# Patient Record
Sex: Male | Born: 1960 | Race: White | Hispanic: No | Marital: Single | Smoking: Current every day smoker
Health system: Southern US, Community
[De-identification: ages and names within clinical notes are randomized; demographics above are authoritative.]

---

## 2009-10-14 ENCOUNTER — Emergency Department: Payer: Self-pay | Admitting: Emergency Medicine

## 2009-12-02 ENCOUNTER — Emergency Department: Payer: Self-pay | Admitting: Emergency Medicine

## 2011-01-06 ENCOUNTER — Emergency Department: Payer: Self-pay | Admitting: Emergency Medicine

## 2011-11-05 ENCOUNTER — Emergency Department: Payer: Self-pay | Admitting: *Deleted

## 2012-09-26 ENCOUNTER — Emergency Department: Payer: Self-pay | Admitting: Emergency Medicine

## 2013-01-20 ENCOUNTER — Emergency Department: Payer: Self-pay | Admitting: Emergency Medicine

## 2013-01-20 LAB — COMPREHENSIVE METABOLIC PANEL
Anion Gap: 5 — ABNORMAL LOW (ref 7–16)
BUN: 10 mg/dL (ref 7–18)
Calcium, Total: 7.7 mg/dL — ABNORMAL LOW (ref 8.5–10.1)
Co2: 27 mmol/L (ref 21–32)
Creatinine: 0.8 mg/dL (ref 0.60–1.30)
EGFR (African American): >60
EGFR (Non-African Amer.): >60
Glucose: 116 mg/dL — ABNORMAL HIGH (ref 65–99)
SGPT (ALT): 22 U/L (ref 12–78)
Sodium: 141 mmol/L (ref 136–145)
Total Protein: 6.9 g/dL (ref 6.4–8.2)

## 2013-01-20 LAB — CBC
HCT: 43.1 % (ref 40.0–52.0)
HGB: 14.4 g/dL (ref 13.0–18.0)
MCH: 31.4 pg (ref 26.0–34.0)
MCHC: 33.5 g/dL (ref 32.0–36.0)
MCV: 94 fL (ref 80–100)
Platelet: 296 10*3/uL (ref 150–440)
RBC: 4.59 10*6/uL (ref 4.40–5.90)
WBC: 9.4 10*3/uL (ref 3.8–10.6)

## 2013-01-20 LAB — DRUG SCREEN, URINE
Cannabinoid 50 Ng, Ur ~~LOC~~: NEGATIVE (ref ?–50)
Cocaine Metabolite,Ur ~~LOC~~: NEGATIVE (ref ?–300)
MDMA (Ecstasy)Ur Screen: NEGATIVE (ref ?–500)
Methadone, Ur Screen: NEGATIVE (ref ?–300)
Opiate, Ur Screen: NEGATIVE (ref ?–300)
Phencyclidine (PCP) Ur S: NEGATIVE (ref ?–25)

## 2013-01-20 LAB — URINALYSIS, COMPLETE
Bacteria: NONE SEEN
Bilirubin,UR: NEGATIVE
Blood: NEGATIVE
Glucose,UR: NEGATIVE mg/dL (ref 0–75)
Leukocyte Esterase: NEGATIVE
Nitrite: NEGATIVE
Protein: NEGATIVE
Specific Gravity: 1.009 (ref 1.003–1.030)
Squamous Epithelial: NONE SEEN

## 2013-01-20 LAB — ACETAMINOPHEN LEVEL: Acetaminophen: <2 ug/mL

## 2013-01-20 LAB — ETHANOL
Ethanol %: <0.003 % (ref 0.000–0.080)
Ethanol: <3 mg/dL

## 2013-01-20 LAB — SALICYLATE LEVEL: Salicylates, Serum: 2.5 mg/dL

## 2014-02-06 ENCOUNTER — Emergency Department: Payer: Self-pay | Admitting: Emergency Medicine

## 2014-10-24 NOTE — Consult Note (Signed)
PATIENT NAME:  Eric Booker, STEWART MR#:  [id number] DATE OF BIRTH:  [birth year]  DATE OF CONSULTATION:  01/21/2013  CONSULTING PHYSICIAN:  Izola PriceFrances C. Jaclynn MajorGreason, MD  CHIEF COMPLAINT: "I came here because I cut my head and they put me in here." When asked how that happened he replied, "I don't know, I don't have any idea." Then he goes on to say, "I don't even remember if I have had a knife, but I don't think I did not that I know of, but I was drinking, I had at least 10 beers. I usually just drink 1 to 2 every other day and then on the weekends  I might do drank 8, 9 or 10." " I use to drink over a 6 pack a day on the weekends, but I was at [facility] and I stayed sober for a while and then I  fell off the wagon." He does go on to say that he is drinking is more controlled then when he went to [facility]. His girlfriend was contacted and she reports "we had a mini argument. He was drinking and waving a knife around and accidentally cut himself in the head."   HISTORY OF PRESENT ILLNESS:  Eric Booker reports that he has a long history of alcohol. He reports that he first started drinking when he was 42. His last drink was on 01/19/2013. He reports that he drinks a beer or two a day and on the weekends he will drank 9 or 10 beers a day, as he buys a 12 pack. He denies any history of seizures or blackouts. He denies any other drug use.   MENTAL STATUS EXAM: Eric Booker is cooperative and talkative on interview. His affect is euthymic and at times puzzled. His thought processes are linear, logical and goal oriented. Memory is intact. He does not endorse any hallucinations, auditory or visual or delusions. There is no suicidal or homicidal ideation, intent or plan and he goes on to point out that he has no psychiatric history.  His primary psychomotor behavior is within normal limits. He is oriented x 4. His speech is cooperative. His insight and judgment are both poor to fair.   SOCIAL HISTORY: He reports that he lives  with his girlfriend and knows that he can return there and he feels safe with her. He reports he does not have an adequate personal support system. He reports that his girlfriend Tresa EndoKelly  is very helpful to him and tries to help him stay sober.   MEDICATIONS: None.   PAST MEDICAL AND SURGICAL HISTORY:  None.   ALLERGIES: BEE STINGS ITCHING, SHORTNESS OF BREATH, HIVES AND ANAPHYLAXIS.   DIAGNOSIS, PRINCIPAL AND PRIMARY:  AXIS I:   1.  Alcohol intoxication.  2.  Alcohol dependence.   AXIS II:  Deferred.   AXIS III: None.   RECOMMENDATIONS: 1.  Mr. Eric Booker Benegas will be detoxed and the Emergency Department.   2.  He has been referred to [facility] after detox.    ____________________________ Izola PriceFrances C. Jaclynn MajorGreason, MD fcg:cc D: 01/21/2013 13:26:58 ET T: 01/21/2013 14:41:45 ET JOB#: [id number]  cc: Izola PriceFrances C. Jaclynn MajorGreason, MD, <Dictator> Maryan PulsFRANCES C Lorrinda Ramstad MD ELECTRONICALLY SIGNED 01/21/2013 15:05

## 2014-10-24 NOTE — Consult Note (Signed)
Brief Consult Note: Diagnosis: Alcohol Dependence, Alcohol Intoxication.   Patient was seen by consultant.   Orders entered.   Comments: Pt seen in ED. He presented after excessive use of alcohol and was playing with a butcher knife and accidently cut himself on the head. he stated that his girlfriend was also present there and she called the police. He stated that he was in the [facility] recently and was unable to maintain sobriety after that. He denied Si/HI or plans.   Plan: Pt will be transferred to [facility] for alcohol dependence and showing impulsive behavior while intoxicated.  He agreed with the plan.  No new meds given at this time.  Electronic Signatures: Rhunette CroftFaheem, Albena Comes S (MD)  (Signed 22-Jul-14 12:51)  Authored: Brief Consult Note   Last Updated: 22-Jul-14 12:51 by Rhunette CroftFaheem, Kimarion Chery S (MD)

## 2016-01-08 ENCOUNTER — Encounter: Payer: Self-pay | Admitting: Emergency Medicine

## 2016-01-08 ENCOUNTER — Emergency Department
Admission: EM | Admit: 2016-01-08 | Discharge: 2016-01-08 | Disposition: A | Discharge status: Home / Self Care | Payer: Managed Care, Other (non HMO) | Attending: Emergency Medicine | Admitting: Emergency Medicine

## 2016-01-08 DIAGNOSIS — E860 Dehydration: Secondary | ICD-10-CM | POA: Insufficient documentation

## 2016-01-08 DIAGNOSIS — F17200 Nicotine dependence, unspecified, uncomplicated: Secondary | ICD-10-CM | POA: Diagnosis not present

## 2016-01-08 DIAGNOSIS — R079 Chest pain, unspecified: Secondary | ICD-10-CM | POA: Diagnosis present

## 2016-01-08 DIAGNOSIS — R197 Diarrhea, unspecified: Secondary | ICD-10-CM

## 2016-01-08 LAB — LIPASE, BLOOD: Lipase: 35 U/L (ref 11–51)

## 2016-01-08 LAB — COMPREHENSIVE METABOLIC PANEL
ALBUMIN: 4.1 g/dL (ref 3.5–5.0)
ALK PHOS: 76 U/L (ref 38–126)
ALT: 26 U/L (ref 17–63)
ANION GAP: 10 (ref 5–15)
AST: 42 U/L — ABNORMAL HIGH (ref 15–41)
BILIRUBIN TOTAL: 1.3 mg/dL — AB (ref 0.3–1.2)
BUN: 6 mg/dL (ref 6–20)
CALCIUM: 9.1 mg/dL (ref 8.9–10.3)
CO2: 25 mmol/L (ref 22–32)
Chloride: 98 mmol/L — ABNORMAL LOW (ref 101–111)
Creatinine, Ser: 0.73 mg/dL (ref 0.61–1.24)
GLUCOSE: 130 mg/dL — AB (ref 65–99)
Potassium: 3.5 mmol/L (ref 3.5–5.1)
Sodium: 133 mmol/L — ABNORMAL LOW (ref 135–145)
TOTAL PROTEIN: 7.8 g/dL (ref 6.5–8.1)

## 2016-01-08 LAB — CBC WITH DIFFERENTIAL/PLATELET
Basophils Absolute: 0.2 10*3/uL — ABNORMAL HIGH (ref 0–0.1)
Basophils Relative: 2 %
EOS ABS: 0.2 10*3/uL (ref 0–0.7)
Eosinophils Relative: 2 %
HEMATOCRIT: 49.1 % (ref 40.0–52.0)
HEMOGLOBIN: 17.5 g/dL (ref 13.0–18.0)
Lymphs Abs: 1.2 10*3/uL (ref 1.0–3.6)
MCH: 32.2 pg (ref 26.0–34.0)
MCHC: 35.5 g/dL (ref 32.0–36.0)
MCV: 90.5 fL (ref 80.0–100.0)
Monocytes Absolute: 0.8 10*3/uL (ref 0.2–1.0)
Neutro Abs: 6.2 10*3/uL (ref 1.4–6.5)
Platelets: 257 10*3/uL (ref 150–440)
RBC: 5.43 MIL/uL (ref 4.40–5.90)
RDW: 13.5 % (ref 11.5–14.5)
WBC: 8.6 10*3/uL (ref 3.8–10.6)

## 2016-01-08 MED ORDER — SODIUM CHLORIDE 0.9 % IV BOLUS (SEPSIS)
1000.0000 mL | Freq: Once | INTRAVENOUS | Status: AC
  Administered 2016-01-08: 1000 mL via INTRAVENOUS

## 2016-01-08 MED ORDER — ONDANSETRON HCL 4 MG/2ML IJ SOLN
4.0000 mg | Freq: Once | INTRAMUSCULAR | Status: AC
  Administered 2016-01-08: 4 mg via INTRAVENOUS
  Filled 2016-01-08: qty 2

## 2016-01-08 NOTE — Discharge Instructions (Signed)
Dehydration, Adult Dehydration is a condition in which you do not have enough fluid or water in your body. It happens when you take in less fluid than you lose. Vital organs such as the kidneys, brain, and heart cannot function without a proper amount of fluids. Any loss of fluids from the body can cause dehydration.  Dehydration can range from mild to severe. This condition should be treated right away to help prevent it from becoming severe. CAUSES  This condition may be caused by:  Vomiting.  Diarrhea.  Excessive sweating, such as when exercising in hot or humid weather.  Not drinking enough fluid during strenuous exercise or during an illness.  Excessive urine output.  Fever.  Certain medicines. RISK FACTORS This condition is more likely to develop in:  People who are taking certain medicines that cause the body to lose excess fluid (diuretics).   People who have a chronic illness, such as diabetes, that may increase urination.  Older adults.   People who live at high altitudes.   People who participate in endurance sports.  SYMPTOMS  Mild Dehydration  Thirst.  Dry lips.  Slightly dry mouth.  Dry, warm skin. Moderate Dehydration  Very dry mouth.   Muscle cramps.   Dark urine and decreased urine production.   Decreased tear production.   Headache.   Light-headedness, especially when you stand up from a sitting position.  Severe Dehydration  Changes in skin.   Cold and clammy skin.   Skin does not spring back quickly when lightly pinched and released.   Changes in body fluids.   Extreme thirst.   No tears.   Not able to sweat when body temperature is high, such as in hot weather.   Minimal urine production.   Changes in vital signs.   Rapid, weak pulse (more than 100 beats per minute when you are sitting still).   Rapid breathing.   Low blood pressure.   Other changes.   Sunken eyes.   Cold hands and feet.    Confusion.  Lethargy and difficulty being awakened.  Fainting (syncope).   Short-term weight loss.   Unconsciousness. DIAGNOSIS  This condition may be diagnosed based on your symptoms. You may also have tests to determine how severe your dehydration is. These tests may include:   Urine tests.   Blood tests.  TREATMENT  Treatment for this condition depends on the severity. Mild or moderate dehydration can often be treated at home. Treatment should be started right away. Do not wait until dehydration becomes severe. Severe dehydration needs to be treated at the hospital. Treatment for Mild Dehydration  Drinking plenty of water to replace the fluid you have lost.   Replacing minerals in your blood (electrolytes) that you may have lost.  Treatment for Moderate Dehydration  Consuming oral rehydration solution (ORS). Treatment for Severe Dehydration  Receiving fluid through an IV tube.   Receiving electrolyte solution through a feeding tube that is passed through your nose and into your stomach (nasogastric tube or NG tube).  Correcting any abnormalities in electrolytes. HOME CARE INSTRUCTIONS   Drink enough fluid to keep your urine clear or pale yellow.   Drink water or fluid slowly by taking small sips. You can also try sucking on ice cubes.  Have food or beverages that contain electrolytes. Examples include bananas and sports drinks.  Take over-the-counter and prescription medicines only as told by your health care provider.   Prepare ORS according to the manufacturer's instructions. Take sips   of ORS every 5 minutes until your urine returns to normal.  If you have vomiting or diarrhea, continue to try to drink water, ORS, or both.   If you have diarrhea, avoid:   Beverages that contain caffeine.   Fruit juice.   Milk.   Carbonated soft drinks.  Do not take salt tablets. This can lead to the condition of having too much sodium in your body  (hypernatremia).  SEEK MEDICAL CARE IF:  You cannot eat or drink without vomiting.  You have had moderate diarrhea during a period of more than 24 hours.  You have a fever. SEEK IMMEDIATE MEDICAL CARE IF:   You have extreme thirst.  You have severe diarrhea.  You have not urinated in 6-8 hours, or you have urinated only a small amount of very dark urine.  You have shriveled skin.  You are dizzy, confused, or both.   This information is not intended to replace advice given to you by your health care provider. Make sure you discuss any questions you have with your health care provider.   Document Released: 06/20/2005 Document Revised: 03/11/2015 Document Reviewed: 11/05/2014 Elsevier Interactive Patient Education 2016 Else[employer]   Diarrhea Diarrhea is frequent loose and watery bowel movements. It can cause you to feel weak and dehydrated. Dehydration can cause you to become tired and thirsty, have a dry mouth, and have decreased urination that often is dark yellow. Diarrhea is a sign of another problem, most often an infection that will not last long. In most cases, diarrhea typically lasts 2-3 days. However, it can last longer if it is a sign of something more serious. It is important to treat your diarrhea as directed by your caregiver to lessen or prevent future episodes of diarrhea. CAUSES  Some common causes include:  Gastrointestinal infections caused by viruses, bacteria, or parasites.  Food poisoning or food allergies.  Certain medicines, such as antibiotics, chemotherapy, and laxatives.  Artificial sweeteners and fructose.  Digestive disorders. HOME CARE INSTRUCTIONS  Ensure adequate fluid intake (hydration): Have 1 cup (8 oz) of fluid for each diarrhea episode. Avoid fluids that contain simple sugars or sports drinks, fruit juices, whole milk products, and sodas. Your urine should be clear or pale yellow if you are drinking enough fluids. Hydrate with an oral  rehydration solution that you can purchase at pharmacies, retail stores, and online. You can prepare an oral rehydration solution at home by mixing the following ingredients together:   - tsp table salt.   tsp baking soda.   tsp salt substitute containing potassium chloride.  1  tablespoons sugar.  1 L (34 oz) of water.  Certain foods and beverages may increase the speed at which food moves through the gastrointestinal (GI) tract. These foods and beverages should be avoided and include:  Caffeinated and alcoholic beverages.  High-fiber foods, such as raw fruits and vegetables, nuts, seeds, and whole grain breads and cereals.  Foods and beverages sweetened with sugar alcohols, such as xylitol, sorbitol, and mannitol.  Some foods may be well tolerated and may help thicken stool including:  Starchy foods, such as rice, toast, pasta, low-sugar cereal, oatmeal, grits, baked potatoes, crackers, and bagels.  Bananas.  Applesauce.  Add probiotic-rich foods to help increase healthy bacteria in the GI tract, such as yogurt and fermented milk products.  Wash your hands well after each diarrhea episode.  Only take over-the-counter or prescription medicines as directed by your caregiver.  Take a warm bath to relieve any   burning or pain from frequent diarrhea episodes. SEEK IMMEDIATE MEDICAL CARE IF:   You are unable to keep fluids down.  You have persistent vomiting.  You have blood in your stool, or your stools are black and tarry.  You do not urinate in 6-8 hours, or there is only a small amount of very dark urine.  You have abdominal pain that increases or localizes.  You have weakness, dizziness, confusion, or light-headedness.  You have a severe headache.  Your diarrhea gets worse or does not get better.  You have a fever or persistent symptoms for more than 2-3 days.  You have a fever and your symptoms suddenly get worse. MAKE SURE YOU:   Understand these  instructions.  Will watch your condition.  Will get help right away if you are not doing well or get worse.   This information is not intended to replace advice given to you by your health care provider. Make sure you discuss any questions you have with your health care provider.   Document Released: 06/10/2002 Document Revised: 07/11/2014 Document Reviewed: 02/26/2012 Elsevier Interactive Patient Education 2016 Elsevier Inc.  

## 2016-01-08 NOTE — ED Notes (Signed)
Reports cp x 3 days, no pain right now. Skin w/d.

## 2016-01-08 NOTE — ED Notes (Signed)
Pt verbalized understanding of discharge instructions. NAD at this time. 

## 2016-01-08 NOTE — ED Provider Notes (Signed)
[facility] Emergency Department Provider Note  ____________________________________________  Time seen: 9:00 AM  I have reviewed the triage vital signs and the nursing notes.   HISTORY  Chief Complaint Chest Pain    HPI Eric Booker is a 55 y.o. male reports multiple complaints. He's been having chest pain is described as sharp, intermittent lasting 5 seconds at a time, left lateral chest wall. Not exertional or pleuritic. Not associated with shortness of breath vomiting diaphoresis or dizziness. No radiation. No aggravating or alleviating factors.  Also reports that for the last 24 hours he's felt that his heart is pounding harder than usual. Also notes that he's had diarrhea for 3 days and had to call out from work during this time. No nausea or vomiting. No sick contacts. He also reports that he doesn't drink much water because his tap water at home doesn't taste good and he forgets to buy bottled water. He agrees that he doesn't drink much by way of oral fluids and is likely not been keeping up with the fluid losses with his diarrhea.     No past medical history on file. None  There are no active problems to display for this patient.    No past surgical history on file. Right index finger amputation  No current outpatient prescriptions on file.  None Allergies Review of patient's allergies indicates no known allergies.   No family history on file.  Social History Social History  Substance Use Topics  . Smoking status: Current Every Day Smoker  . Smokeless tobacco: None  . Alcohol Use: None    Review of Systems  Constitutional:   No fever or chills.  ENT:   No sore throat. No rhinorrhea. Cardiovascular:   Positive as above chest pain. Positive palpitations Respiratory:   No dyspnea or cough. Gastrointestinal:   Negative for abdominal pain, no vomiting. Positive diarrhea.  Genitourinary:   Negative for dysuria or difficulty  urinating. Musculoskeletal:   Negative for focal pain or swelling Neurological:   Negative for headaches 10-point ROS otherwise negative.  ____________________________________________   PHYSICAL EXAM:  VITAL SIGNS: ED Triage Vitals  Enc Vitals Group     BP 01/08/16 0853 136/94 mmHg     Pulse Rate 01/08/16 0853 109     Resp 01/08/16 0853 18     Temp 01/08/16 0853 98 F (36.7 C)     Temp Source 01/08/16 0853 Oral     SpO2 01/08/16 0853 97 %     Weight 01/08/16 0853 180 lb (81.647 kg)     Height 01/08/16 0853 6\' 1"  (1.854 m)     Head Cir --      Peak Flow --      Pain Score --      Pain Loc --      Pain Edu? --      Excl. in GC? --     Vital signs reviewed, nursing assessments reviewed.   Constitutional:   Alert and oriented. Well appearing and in no distress. Eyes:   No scleral icterus. No conjunctival pallor. PERRL. EOMI.  No nystagmus. ENT   Head:   Normocephalic and atraumatic.   Nose:   No congestion/rhinnorhea. No septal hematoma   Mouth/Throat:   Dry mucous membranes, no pharyngeal erythema. No peritonsillar mass.    Neck:   No stridor. No SubQ emphysema. No meningismus. Hematological/Lymphatic/Immunilogical:   No cervical lymphadenopathy. Cardiovascular:   Tachycardia heart rate 110, regular. Symmetric bilateral radial and DP pulses.  No murmurs.  Respiratory:   Normal respiratory effort without tachypnea nor retractions. Breath sounds are clear and equal bilaterally. No wheezes/rales/rhonchi. Gastrointestinal:   Soft with mild left upper quadrant tenderness. Non distended. There is no CVA tenderness.  No rebound, rigidity, or guarding. Genitourinary:   deferred Musculoskeletal:   Nontender with normal range of motion in all extremities. No joint effusions.  No lower extremity tenderness.  No edema. Neurologic:   Normal speech and language.  CN 2-10 normal. Motor grossly intact. No gross focal neurologic deficits are appreciated.  Skin:    Skin is  warm, dry and intact. No rash noted.  No petechiae, purpura, or bullae.  ____________________________________________    LABS (pertinent positives/negatives) (all labs ordered are listed, but only abnormal results are displayed) Labs Reviewed  COMPREHENSIVE METABOLIC PANEL - Abnormal; Notable for the following:    Sodium 133 (*)    Chloride 98 (*)    Glucose, Bld 130 (*)    AST 42 (*)    Total Bilirubin 1.3 (*)    All other components within normal limits  CBC WITH DIFFERENTIAL/PLATELET - Abnormal; Notable for the following:    Basophils Absolute 0.2 (*)    All other components within normal limits  LIPASE, BLOOD   ____________________________________________   EKG  Interpreted by me Sinus tachycardia rate 103, normal axis and intervals. Poor R-wave progression in anterior precordial leads. Normal ST segments and T waves  ____________________________________________    RADIOLOGY    ____________________________________________   PROCEDURES   ____________________________________________   INITIAL IMPRESSION / ASSESSMENT AND PLAN / ED COURSE  Pertinent labs & imaging results that were available during my care of the patient were reviewed by me and considered in my medical decision making (see chart for details).  Patient well appearing no acute distress. Presents with fleeting chest pain that is on concerning at the moment. EKG unremarkable. Clinically appears to be dehydrated likely due to GI losses and inadequate oral intake.Considering the patient's symptoms, medical history, and physical examination today, I have low suspicion for ACS, PE, TAD, pneumothorax, carditis, mediastinitis, pneumonia, CHF, or sepsis.  Check labs, give IV saline. Oral challenge. Anticipate needing a discharge the patient home later once reassuring workup is obtained.   ----------------------------------------- 9:56 AM on 01/08/2016 -----------------------------------------  Labs  unremarkable. Heart rate improved to 80 with about 800 mL of saline infused. Patient tolerating oral intake and feels much better. We'll discharge home, follow up with primary care, patient will increase his oral fluid intake. Diarrhea does not appear to be infectious in origin.   ____________________________________________   FINAL CLINICAL IMPRESSION(S) / ED DIAGNOSES  Final diagnoses:  Dehydration  Diarrhea, unspecified type       Portions of this note were generated with dragon dictation software. Dictation errors may occur despite best attempts at proofreading.   Sharman CheekPhillip Tunis Gentle, MD 01/08/16 [phone]

## 2018-04-10 ENCOUNTER — Emergency Department
Admission: EM | Admit: 2018-04-10 | Discharge: 2018-04-10 | Discharge status: Against Medical Advice | Payer: 59 | Attending: Emergency Medicine | Admitting: Emergency Medicine

## 2018-04-10 ENCOUNTER — Other Ambulatory Visit: Payer: Self-pay

## 2018-04-10 DIAGNOSIS — F17200 Nicotine dependence, unspecified, uncomplicated: Secondary | ICD-10-CM | POA: Insufficient documentation

## 2018-04-10 DIAGNOSIS — F1020 Alcohol dependence, uncomplicated: Secondary | ICD-10-CM

## 2018-04-10 LAB — CBC WITH DIFFERENTIAL/PLATELET
ABS IMMATURE GRANULOCYTES: 0.04 10*3/uL (ref 0.00–0.07)
Basophils Absolute: 0 10*3/uL (ref 0.0–0.1)
Basophils Relative: 1 %
Eosinophils Absolute: 0 10*3/uL (ref 0.0–0.5)
Eosinophils Relative: 0 %
HCT: 44.1 % (ref 39.0–52.0)
HEMOGLOBIN: 15.5 g/dL (ref 13.0–17.0)
IMMATURE GRANULOCYTES: 1 %
LYMPHS ABS: 1.7 10*3/uL (ref 0.7–4.0)
LYMPHS PCT: 21 %
MCH: 32.4 pg (ref 26.0–34.0)
MCHC: 35.1 g/dL (ref 30.0–36.0)
MCV: 92.1 fL (ref 80.0–100.0)
MONOS PCT: 11 %
Monocytes Absolute: 0.9 10*3/uL (ref 0.1–1.0)
NEUTROS ABS: 5.4 10*3/uL (ref 1.7–7.7)
Neutrophils Relative %: 66 %
PLATELETS: 225 10*3/uL (ref 150–400)
RBC: 4.79 MIL/uL (ref 4.22–5.81)
RDW: 13.5 % (ref 11.5–15.5)
WBC: 8.1 10*3/uL (ref 4.0–10.5)
nRBC: 0 % (ref 0.0–0.2)

## 2018-04-10 LAB — COMPREHENSIVE METABOLIC PANEL
ALK PHOS: 43 U/L (ref 38–126)
ALT: 32 U/L (ref 0–44)
AST: 56 U/L — AB (ref 15–41)
Albumin: 4.1 g/dL (ref 3.5–5.0)
Anion gap: 15 (ref 5–15)
BUN: 7 mg/dL (ref 6–20)
CO2: 22 mmol/L (ref 22–32)
Calcium: 8.5 mg/dL — ABNORMAL LOW (ref 8.9–10.3)
Chloride: 99 mmol/L (ref 98–111)
Creatinine, Ser: 0.59 mg/dL — ABNORMAL LOW (ref 0.61–1.24)
GFR calc non Af Amer: >60 mL/min (ref >60)
GLUCOSE: 95 mg/dL (ref 70–99)
POTASSIUM: 3.9 mmol/L (ref 3.5–5.1)
Sodium: 136 mmol/L (ref 135–145)
TOTAL PROTEIN: 7.1 g/dL (ref 6.5–8.1)
Total Bilirubin: 0.6 mg/dL (ref 0.3–1.2)

## 2018-04-10 LAB — URINE DRUG SCREEN, QUALITATIVE (ARMC ONLY)
AMPHETAMINES, UR SCREEN: NOT DETECTED
Barbiturates, Ur Screen: NOT DETECTED
Benzodiazepine, Ur Scrn: NOT DETECTED
COCAINE METABOLITE, UR ~~LOC~~: NOT DETECTED
Cannabinoid 50 Ng, Ur ~~LOC~~: NOT DETECTED
MDMA (ECSTASY) UR SCREEN: NOT DETECTED
Methadone Scn, Ur: NOT DETECTED
OPIATE, UR SCREEN: NOT DETECTED
PHENCYCLIDINE (PCP) UR S: NOT DETECTED
Tricyclic, Ur Screen: NOT DETECTED

## 2018-04-10 LAB — ETHANOL: Alcohol, Ethyl (B): 237 mg/dL — ABNORMAL HIGH (ref <10)

## 2018-04-10 LAB — TROPONIN I: Troponin I: <0.03 ng/mL (ref <0.03)

## 2018-04-10 MED ORDER — LORAZEPAM 2 MG PO TABS
2.0000 mg | ORAL_TABLET | Freq: Once | ORAL | Status: DC
  Filled 2018-04-10: qty 1

## 2018-04-10 NOTE — BH Assessment (Signed)
Writer discussed with the patient about the option for detox/treatment.  Patient was in the agreement with the plan.  Patient signed the Release of information, in order to send labs and ER Note.  Information sent sent to [facility] & [facility].

## 2018-04-10 NOTE — ED Provider Notes (Signed)
The patient left the emergency department several times, and we reiterated that he needed to stay in order to continue to receive treatment.  He did say that he would be willing to wait and we were able to get him a spot for inpatient detoxification at [facility].  Unfortunately, the patient is not willing to stay and has left at this time.   Eric Menghini, MD 04/10/18 [phone]

## 2018-04-10 NOTE — ED Notes (Signed)
Pt in lobby, requesting to come back to his bed. RN informed by patient relations representative. EDP, charge nurse and TTS made aware.

## 2018-04-10 NOTE — ED Provider Notes (Signed)
[facility] Emergency Department Provider Note       Time seen: ----------------------------------------- 1:28 PM on 04/10/2018 -----------------------------------------   I have reviewed the triage vital signs and the nursing notes.  HISTORY   Chief Complaint Alcohol Problem    HPI Eric Booker is a 58 y.o. male with a history of alcohol abuse who presents to the ED for detox.  Patient was sent here from detox because his alcohol level is too high.  They were concerned because he had an alcohol level near 400.  He denies any complaints at this time.  Patient states he is trying to finally quit drinking because he started having trouble remembering what happened at the end of football games.  No past medical history on file.  There are no active problems to display for this patient.  Allergies Bee venom  Social History Social History   Tobacco Use  . Smoking status: Current Every Day Smoker  . Smokeless tobacco: Current User  Substance Use Topics  . Alcohol use: Yes    Alcohol/week: 12.0 standard drinks    Types: 12 Cans of beer per week  . Drug use: Not on file   Review of Systems Constitutional: Negative for fever. Cardiovascular: Negative for chest pain. Respiratory: Negative for shortness of breath. Gastrointestinal: Negative for abdominal pain, vomiting and diarrhea. Musculoskeletal: Negative for back pain. Skin: Negative for rash. Neurological: Negative for headaches, focal weakness or numbness.  Positive for memory disturbance  All systems negative/normal/unremarkable except as stated in the HPI  ____________________________________________   PHYSICAL EXAM:  VITAL SIGNS: ED Triage Vitals  Enc Vitals Group     BP 04/10/18 1318 (!) 142/88     Pulse Rate 04/10/18 1318 84     Resp 04/10/18 1318 20     Temp 04/10/18 1318 98.3 F (36.8 C)     Temp Source 04/10/18 1318 Oral     SpO2 04/10/18 1318 100 %     Weight 04/10/18 1314  180 lb (81.6 kg)     Height 04/10/18 1314 (!) 6" (0.152 m)     Head Circumference --      Peak Flow --      Pain Score 04/10/18 1314 0     Pain Loc --      Pain Edu? --      Excl. in GC? --    Constitutional: Alert and oriented.  Smells of alcohol, no distress Eyes: Conjunctivae are normal. Normal extraocular movements. ENT   Head: Normocephalic and atraumatic.   Nose: No congestion/rhinnorhea.   Mouth/Throat: Mucous membranes are moist.   Neck: No stridor. Cardiovascular: Normal rate, regular rhythm. No murmurs, rubs, or gallops. Respiratory: Normal respiratory effort without tachypnea nor retractions. Breath sounds are clear and equal bilaterally. No wheezes/rales/rhonchi. Gastrointestinal: Soft and nontender. Normal bowel sounds Musculoskeletal: Nontender with normal range of motion in extremities. No lower extremity tenderness nor edema. Neurologic:  Normal speech and language. No gross focal neurologic deficits are appreciated.  Mild tremor is noted Skin:  Skin is warm, dry and intact. No rash noted. Psychiatric: Mood and affect are normal. Speech and behavior are normal.  ____________________________________________  ED COURSE:  As part of my medical decision making, I reviewed the following data within the electronic MEDICAL RECORD NUMBER History obtained from family if available, nursing notes, old chart and ekg, as well as notes from prior ED visits. Patient presented for detox clearance, we will assess with labs and imaging as indicated at this time.  Clinical Course as of Apr 10 1452  Tue Apr 10, 2018  1358 Patient has refused Ativan for alcohol detox   [JW]    Clinical Course User Index [JW] Emily Filbert, MD   Procedures ____________________________________________   LABS (pertinent positives/negatives)  Labs Reviewed  ETHANOL - Abnormal; Notable for the following components:      Result Value   Alcohol, Ethyl (B) 237 (*)    All other components  within normal limits  CBC WITH DIFFERENTIAL/PLATELET  COMPREHENSIVE METABOLIC PANEL  TROPONIN I  URINE DRUG SCREEN, QUALITATIVE ([facility] ONLY)   ___________________________________________  DIFFERENTIAL DIAGNOSIS   Alcohol use disorder, intoxication  FINAL ASSESSMENT AND PLAN  Alcohol use disorder   Plan: The patient had presented for high alcohol levels.  Patient refused Ativan here to prevent severe withdrawal.  Alcohol level was not as high as expected.   Ulice Dash, MD   Note: This note was generated in part or whole with voice recognition software. Voice recognition is usually quite accurate but there are transcription errors that can and very often do occur. I apologize for any typographical errors that were not detected and corrected.     Emily Filbert, MD 04/10/18 [id number]

## 2018-04-10 NOTE — ED Notes (Signed)
Pt informed staff he is ready to leave the ER.   EDP made aware. Pt agreed wait for discharge paperwork and follow up information from TTS.

## 2018-04-10 NOTE — ED Notes (Signed)
Pt has returned to 19 hall. Pt to wait for transportation to [facility] later this evening.

## 2018-04-10 NOTE — Discharge Instructions (Addendum)
Please return to the emergency department for shakiness, high heart rate, seizures, thoughts of hurting yourself or anyone else, or for any other symptoms concerning to you.

## 2018-04-10 NOTE — ED Notes (Signed)
Pt walked out of ER. Pt told the writer he had found better help somewhere else. EDP, TTS and charge nurse made aware.

## 2018-04-10 NOTE — BH Assessment (Signed)
Patient has been accepted to [facility].  Patient assigned to [facility] Accepting physician is Dr. Erling Conte.  Call report to [phone]. [phone].  Representative was Clydie Braun.   ER Staff is aware of it:  [place], ER Secretary  Dr. Sharma Covert,, ER MD  Amy B., Patient's Nurse   Patient be will be available after 8:00apm. Patient to transfer via [employer].  Address: [address] [facility]  [place], [place] [address]

## 2018-04-10 NOTE — ED Triage Notes (Signed)
Pt arrived via ems fo alcohol detox. Pt NAD, A&O x4. Pt not suicidal or homicidal. Pt pleasant and cooperative.

## 2018-04-10 NOTE — BH Assessment (Signed)
Assessment Note  Eric Booker is an 57 y.o. male who presents to the ER after he was seen at the local community mental health agency ([facility]). He was seeking assistance for alcohol abuse. He drinks on a daily basis and it's in the amount of 12 pack of 12oz beers. He further states he has drank this amount and frequency for several years and he is unsure of how long he's been sober. Withdrawal symptoms include; shakes, nausea, cold sweats and some weakness. Patient denies history of seizures and blacks. Patient also denies involvement with the legal system and no history of violence.   Upon arrival to the ER patient BAC was 237. UDS hadn't resulted when this assessment was completed.  Patient denies SI/HI and AV/H.  Diagnosis: Alcohol Abuse   Past Medical History: No past medical history on file.  Family History: No family history on file.  Social History:  reports that he has been smoking. He uses smokeless tobacco. He reports that he drinks about 12.0 standard drinks of alcohol per week. His drug history is not on file.  Additional Social History:  Alcohol / Drug Use Pain Medications: See PTA Prescriptions: See PTA Over the Counter: See PTA History of alcohol / drug use?: Yes Longest period of sobriety (when/how long): "For about a day" Negative Consequences of Use: Personal relationships Withdrawal Symptoms: Weakness, Sweats, Fever / Chills, Tremors Substance #1 Name of Substance 1: Alcohol 1 - Age of First Use: 18 1 - Amount (size/oz): "12 pack" 1 - Frequency: Daily 1 - Duration: "For years" 1 - Last Use / Amount: 04/10/2018  CIWA: CIWA-Ar BP: (!) 142/88 Pulse Rate: 84 COWS:    Allergies:  Allergies  Allergen Reactions  . Bee Venom Swelling    Home Medications:  (Not in a hospital admission)  OB/GYN Status:  No LMP for male patient.  General Assessment Data Location of Assessment: [facility] ED TTS Assessment: In system Is this a Tele or Face-to-Face Assessment?:  Face-to-Face Is this an Initial Assessment or a Re-assessment for this encounter?: Initial Assessment Patient Accompanied by:: N/A Language Other than English: No Living Arrangements: Other (Comment)(Private Home) What gender do you identify as?: Male Marital status: Long term relationship Pregnancy Status: No Living Arrangements: Spouse/significant other Can pt return to current living arrangement?: Yes Admission Status: Voluntary Is patient capable of signing voluntary admission?: Yes Referral Source: Self/Family/Friend Insurance type: Designer, industrial/product Exam [facility] Walk-in ONLY) Medical Exam completed: Yes  Crisis Care Plan Living Arrangements: Spouse/significant other Legal Guardian: Other:(Self) Name of Psychiatrist: Reports of none Name of Therapist: Reports of none  Education Status Is patient currently in school?: No Is the patient employed, unemployed or receiving disability?: Employed  Risk to self with the past 6 months Suicidal Ideation: No Has patient been a risk to self within the past 6 months prior to admission? : No Suicidal Intent: No Has patient had any suicidal intent within the past 6 months prior to admission? : No Is patient at risk for suicide?: No Suicidal Plan?: No Has patient had any suicidal plan within the past 6 months prior to admission? : No Access to Means: No What has been your use of drugs/alcohol within the last 12 months?: Alcohol Previous Attempts/Gestures: No How many times?: 0 Other Self Harm Risks: Active addiction Triggers for Past Attempts: None known Intentional Self Injurious Behavior: None Family Suicide History: No Recent stressful life event(s): Other (Comment)(Active addiction) Persecutory voices/beliefs?: No Depression: No Depression Symptoms: Guilt Substance abuse history  and/or treatment for substance abuse?: Yes Suicide prevention information given to non-admitted patients: Not applicable  Risk to Others  within the past 6 months Homicidal Ideation: No Does patient have any lifetime risk of violence toward others beyond the six months prior to admission? : No Thoughts of Harm to Others: No Current Homicidal Intent: No Current Homicidal Plan: No Access to Homicidal Means: No Identified Victim: Reports of none History of harm to others?: No Assessment of Violence: None Noted Violent Behavior Description: Reports of none Does patient have access to weapons?: No Criminal Charges Pending?: No Does patient have a court date: No Is patient on probation?: No  Psychosis Hallucinations: None noted Delusions: None noted  Mental Status Report Appearance/Hygiene: Unremarkable, In scrubs Eye Contact: Fair Motor Activity: Freedom of movement, Unremarkable Speech: Logical/coherent, Unremarkable Level of Consciousness: Alert Mood: Sad, Pleasant Affect: Appropriate to circumstance Anxiety Level: Minimal Thought Processes: Coherent, Relevant Judgement: Impaired Orientation: Person, Place, Time, Situation, Appropriate for developmental age Obsessive Compulsive Thoughts/Behaviors: Minimal  Cognitive Functioning Concentration: Normal Memory: Recent Intact, Remote Intact Is patient IDD: No Insight: Fair Impulse Control: Fair Appetite: Good Have you had any weight changes? : No Change Sleep: No Change Total Hours of Sleep: 8 Vegetative Symptoms: None  ADLScreening [facility] Assessment Services) Patient's cognitive ability adequate to safely complete daily activities?: Yes Patient able to express need for assistance with ADLs?: Yes Independently performs ADLs?: Yes (appropriate for developmental age)  Prior Inpatient Therapy Prior Inpatient Therapy: Yes Prior Therapy Dates: 2013 Prior Therapy Facilty/Provider(s): [facility] Reason for Treatment: Substance Abuse  Prior Outpatient Therapy Prior Outpatient Therapy: No Does patient have an ACCT team?: No Does patient have Intensive In-House  Services?  : No Does patient have [facility] services? : No Does patient have P4CC services?: No  ADL Screening (condition at time of admission) Patient's cognitive ability adequate to safely complete daily activities?: Yes Is the patient deaf or have difficulty hearing?: No Does the patient have difficulty seeing, even when wearing glasses/contacts?: No Does the patient have difficulty concentrating, remembering, or making decisions?: No Patient able to express need for assistance with ADLs?: Yes Does the patient have difficulty dressing or bathing?: No Independently performs ADLs?: Yes (appropriate for developmental age) Does the patient have difficulty walking or climbing stairs?: No Weakness of Legs: None Weakness of Arms/Hands: None  Home Assistive Devices/Equipment Home Assistive Devices/Equipment: None  Therapy Consults (therapy consults require a physician order) PT Evaluation Needed: No OT Evalulation Needed: No SLP Evaluation Needed: No Abuse/Neglect Assessment (Assessment to be complete while patient is alone) Abuse/Neglect Assessment Can Be Completed: Yes Physical Abuse: Denies Verbal Abuse: Denies Sexual Abuse: Denies Exploitation of patient/patient's resources: Denies Self-Neglect: Denies Values / Beliefs Cultural Requests During Hospitalization: None Spiritual Requests During Hospitalization: None Consults Spiritual Care Consult Needed: No Social Work Consult Needed: No Merchant navy officer (For Healthcare) Does Patient Have a Medical Advance Directive?: No Would patient like information on creating a medical advance directive?: No - Patient declined       Child/Adolescent Assessment Running Away Risk: (Patient is an adult)  Disposition:     On Site Evaluation by:   Reviewed with Physician:    Lilyan Gilford MS, LCAS, LPC, NCC, CCSI Therapeutic Triage Specialist 04/10/2018 2:11 PM

## 2018-04-10 NOTE — ED Notes (Signed)
Pt walked out of ER. Pt stated he had found better help somewhere else. EDP, TTS and charge nurse made aware.

## 2018-04-10 NOTE — ED Notes (Addendum)
Pt walked out of ER.   *Pt was up for discharge, but did not wait for discharge paperwork or information from TTS.

## 2019-05-01 ENCOUNTER — Emergency Department: Payer: 59

## 2019-05-01 ENCOUNTER — Emergency Department
Admission: EM | Admit: 2019-05-01 | Discharge: 2019-05-01 | Disposition: A | Discharge status: Home / Self Care | Payer: 59 | Attending: Student in an Organized Health Care Education/Training Program | Admitting: Student in an Organized Health Care Education/Training Program

## 2019-05-01 ENCOUNTER — Other Ambulatory Visit: Payer: Self-pay

## 2019-05-01 DIAGNOSIS — F17200 Nicotine dependence, unspecified, uncomplicated: Secondary | ICD-10-CM | POA: Insufficient documentation

## 2019-05-01 DIAGNOSIS — E860 Dehydration: Secondary | ICD-10-CM | POA: Diagnosis not present

## 2019-05-01 DIAGNOSIS — R1013 Epigastric pain: Secondary | ICD-10-CM | POA: Diagnosis present

## 2019-05-01 LAB — COMPREHENSIVE METABOLIC PANEL
ALT: 32 U/L (ref 0–44)
AST: 64 U/L — ABNORMAL HIGH (ref 15–41)
Albumin: 4.1 g/dL (ref 3.5–5.0)
Alkaline Phosphatase: 52 U/L (ref 38–126)
Anion gap: 17 — ABNORMAL HIGH (ref 5–15)
BUN: 7 mg/dL (ref 6–20)
CO2: 21 mmol/L — ABNORMAL LOW (ref 22–32)
Calcium: 8.7 mg/dL — ABNORMAL LOW (ref 8.9–10.3)
Chloride: 96 mmol/L — ABNORMAL LOW (ref 98–111)
Creatinine, Ser: 0.65 mg/dL (ref 0.61–1.24)
GFR calc Af Amer: >60 mL/min (ref >60)
GFR calc non Af Amer: >60 mL/min (ref >60)
Glucose, Bld: 85 mg/dL (ref 70–99)
Potassium: 4.1 mmol/L (ref 3.5–5.1)
Sodium: 134 mmol/L — ABNORMAL LOW (ref 135–145)
Total Bilirubin: 0.8 mg/dL (ref 0.3–1.2)
Total Protein: 7.2 g/dL (ref 6.5–8.1)

## 2019-05-01 LAB — CBC
HCT: 42.9 % (ref 39.0–52.0)
Hemoglobin: 15.1 g/dL (ref 13.0–17.0)
MCH: 31.9 pg (ref 26.0–34.0)
MCHC: 35.2 g/dL (ref 30.0–36.0)
MCV: 90.7 fL (ref 80.0–100.0)
Platelets: 228 10*3/uL (ref 150–400)
RBC: 4.73 MIL/uL (ref 4.22–5.81)
RDW: 13.4 % (ref 11.5–15.5)
WBC: 8 10*3/uL (ref 4.0–10.5)
nRBC: 0 % (ref 0.0–0.2)

## 2019-05-01 LAB — LIPASE, BLOOD: Lipase: 26 U/L (ref 11–51)

## 2019-05-01 LAB — URINALYSIS, COMPLETE (UACMP) WITH MICROSCOPIC
Bacteria, UA: NONE SEEN
Bilirubin Urine: NEGATIVE
Glucose, UA: NEGATIVE mg/dL
Ketones, ur: 20 mg/dL — AB
Leukocytes,Ua: NEGATIVE
Nitrite: NEGATIVE
Protein, ur: NEGATIVE mg/dL
Specific Gravity, Urine: >1.046 — ABNORMAL HIGH (ref 1.005–1.030)
pH: 6 (ref 5.0–8.0)

## 2019-05-01 LAB — GLUCOSE, CAPILLARY: Glucose-Capillary: 81 mg/dL (ref 70–99)

## 2019-05-01 MED ORDER — LIDOCAINE VISCOUS HCL 2 % MT SOLN
15.0000 mL | Freq: Once | OROMUCOSAL | Status: AC
  Administered 2019-05-01: 15 mL via ORAL
  Filled 2019-05-01: qty 15

## 2019-05-01 MED ORDER — METOCLOPRAMIDE HCL 5 MG/ML IJ SOLN
10.0000 mg | Freq: Once | INTRAMUSCULAR | Status: AC
  Administered 2019-05-01: 10 mg via INTRAVENOUS
  Filled 2019-05-01: qty 2

## 2019-05-01 MED ORDER — SODIUM CHLORIDE 0.9 % IV BOLUS
1000.0000 mL | Freq: Once | INTRAVENOUS | Status: AC
Start: 2019-05-01 — End: 2019-05-01
  Administered 2019-05-01: 1000 mL via INTRAVENOUS

## 2019-05-01 MED ORDER — METOCLOPRAMIDE HCL 10 MG PO TABS: 10.0000 mg | ORAL_TABLET | Freq: Three times a day (TID) | ORAL | 0 refills | Status: DC | PRN

## 2019-05-01 MED ORDER — IOHEXOL 300 MG/ML  SOLN
100.0000 mL | Freq: Once | INTRAMUSCULAR | Status: AC | PRN
  Administered 2019-05-01: 100 mL via INTRAVENOUS
  Filled 2019-05-01: qty 100

## 2019-05-01 MED ORDER — ALUM & MAG HYDROXIDE-SIMETH 200-200-20 MG/5ML PO SUSP
30.0000 mL | Freq: Once | ORAL | Status: AC
  Administered 2019-05-01: 30 mL via ORAL
  Filled 2019-05-01: qty 30

## 2019-05-01 NOTE — ED Provider Notes (Signed)
[facility] Emergency Department Provider Note    First MD Initiated Contact with Patient 05/01/19 1312     (approximate)  I have reviewed the triage vital signs and the nursing notes.   HISTORY  Chief Complaint Fatigue    HPI Eric Booker is a 58 y.o. male history of smoking as well as alcohol use presents the ER for several weeks of intermittent epigastric discomfort and bloating now with 3 days of decreased appetite and epigastric discomfort with feeling full.  Also having nonbloody nonmelanotic watery diarrhea.  He denies any chest pain or shortness of breath.  No palpitations.  States that he will eat crackers with peanut butter at work and feel full.  Is uncertain if he feels anxious about something.  Not having any pain radiating through to his back.  No previous abdominal surgeries.  He is still passing gas and moving his bowels.    History reviewed. No pertinent past medical history. No family history on file. History reviewed. No pertinent surgical history. There are no active problems to display for this patient.     Prior to Admission medications   Medication Sig Start Date End Date Taking? Authorizing Provider  metoCLOPramide (REGLAN) 10 MG tablet Take 1 tablet (10 mg total) by mouth every 8 (eight) hours as needed for nausea. 05/01/19 04/30/20  Merlyn Lot, MD    Allergies Bee venom    Social History Social History   Tobacco Use  . Smoking status: Current Every Day Smoker  . Smokeless tobacco: Current User  Substance Use Topics  . Alcohol use: Yes    Alcohol/week: 12.0 standard drinks    Types: 12 Cans of beer per week  . Drug use: Not on file    Review of Systems Patient denies headaches, rhinorrhea, blurry vision, numbness, shortness of breath, chest pain, edema, cough, abdominal pain, nausea, vomiting, diarrhea, dysuria, fevers, rashes or hallucinations unless otherwise stated above in HPI.  ____________________________________________   PHYSICAL EXAM:  VITAL SIGNS: Vitals:   05/01/19 1315 05/01/19 1532  BP: 137/89 (!) 142/77  Pulse: 85 88  Resp: 16 17  Temp:    SpO2: 98% 98%    Constitutional: Alert and oriented.  Eyes: Conjunctivae are normal.  Head: Atraumatic. Nose: No congestion/rhinnorhea. Mouth/Throat: Mucous membranes are moist.   Neck: No stridor. Painless ROM.  Cardiovascular: Normal rate, regular rhythm. Grossly normal heart sounds.  Good peripheral circulation. Respiratory: Normal respiratory effort.  No retractions. Lungs CTAB. Gastrointestinal: Soft and nontender in all four quadrants.  No distention. No abdominal bruits. No CVA tenderness. Genitourinary:  Musculoskeletal: No lower extremity tenderness nor edema.  No joint effusions. Neurologic:  Normal speech and language. No gross focal neurologic deficits are appreciated. No facial droop Skin:  Skin is warm, dry and intact. No rash noted. Psychiatric: Mood and affect are normal. Speech and behavior are normal.  ____________________________________________   LABS (all labs ordered are listed, but only abnormal results are displayed)  Results for orders placed or performed during the hospital encounter of 05/01/19 (from the past 24 hour(s))  Lipase, blood     Status: None   Collection Time: 05/01/19 10:01 AM  Result Value Ref Range   Lipase 26 11 - 51 U/L  Comprehensive metabolic panel     Status: Abnormal   Collection Time: 05/01/19 10:01 AM  Result Value Ref Range   Sodium 134 (L) 135 - 145 mmol/L   Potassium 4.1 3.5 - 5.1 mmol/L   Chloride 96 (L)  98 - 111 mmol/L   CO2 21 (L) 22 - 32 mmol/L   Glucose, Bld 85 70 - 99 mg/dL   BUN 7 6 - 20 mg/dL   Creatinine, Ser 1.61 0.61 - 1.24 mg/dL   Calcium 8.7 (L) 8.9 - 10.3 mg/dL   Total Protein 7.2 6.5 - 8.1 g/dL   Albumin 4.1 3.5 - 5.0 g/dL   AST 64 (H) 15 - 41 U/L   ALT 32 0 - 44 U/L   Alkaline Phosphatase 52 38 - 126 U/L   Total Bilirubin  0.8 0.3 - 1.2 mg/dL   GFR calc non Af Amer >60 >60 mL/min   GFR calc Af Amer >60 >60 mL/min   Anion gap 17 (H) 5 - 15  CBC     Status: None   Collection Time: 05/01/19 10:01 AM  Result Value Ref Range   WBC 8.0 4.0 - 10.5 K/uL   RBC 4.73 4.22 - 5.81 MIL/uL   Hemoglobin 15.1 13.0 - 17.0 g/dL   HCT 09.6 04.5 - 40.9 %   MCV 90.7 80.0 - 100.0 fL   MCH 31.9 26.0 - 34.0 pg   MCHC 35.2 30.0 - 36.0 g/dL   RDW 81.1 91.4 - 78.2 %   Platelets 228 150 - 400 K/uL   nRBC 0.0 0.0 - 0.2 %  Glucose, capillary     Status: None   Collection Time: 05/01/19 10:09 AM  Result Value Ref Range   Glucose-Capillary 81 70 - 99 mg/dL  Urinalysis, Complete w Microscopic     Status: Abnormal   Collection Time: 05/01/19  3:33 PM  Result Value Ref Range   Color, Urine YELLOW (A) YELLOW   APPearance CLEAR (A) CLEAR   Specific Gravity, Urine >1.046 (H) 1.005 - 1.030   pH 6.0 5.0 - 8.0   Glucose, UA NEGATIVE NEGATIVE mg/dL   Hgb urine dipstick SMALL (A) NEGATIVE   Bilirubin Urine NEGATIVE NEGATIVE   Ketones, ur 20 (A) NEGATIVE mg/dL   Protein, ur NEGATIVE NEGATIVE mg/dL   Nitrite NEGATIVE NEGATIVE   Leukocytes,Ua NEGATIVE NEGATIVE   RBC / HPF 0-5 0 - 5 RBC/hpf   WBC, UA 0-5 0 - 5 WBC/hpf   Bacteria, UA NONE SEEN NONE SEEN   Squamous Epithelial / LPF 0-5 0 - 5   Mucus PRESENT    ____________________________________________ ________________________________  RADIOLOGY  I personally reviewed all radiographic images ordered to evaluate for the above acute complaints and reviewed radiology reports and findings.  These findings were personally discussed with the patient.  Please see medical record for radiology report.  ____________________________________________   PROCEDURES  Procedure(s) performed:  Procedures    Critical Care performed: no ____________________________________________   INITIAL IMPRESSION / ASSESSMENT AND PLAN / ED COURSE  Pertinent labs & imaging results that were available  during my care of the patient were reviewed by me and considered in my medical decision making (see chart for details).   DDX: gastritis, enteritis, sbo, pud, withdrawal, viral illness  Eric Booker is a 58 y.o. who presents to the ED with symptoms as described above.  Patient nontoxic-appearing.  Does appear somewhat dehydrated.  Has had similar presentations in the past.  Does have heavy alcohol use history but last drink was in the past 24 hours.  No signs of withdrawal.  Does not want help with detox.  Given his history exam will give IV fluids check blood work and radiographs.  Clinical Course as of Apr 30 1610  Wed May 01, 2019  1429 Radiographs suggestive of obstructive process.  Will further evaluate with CT imaging.   [PR]  1611 Patient to nursing does requesting discharge.  Feels well after antiemetic.  He is received IV fluids.  He is tolerating oral hydration.  Repeat exam is reassuring.  Remains hemodynamically stable.  CT imaging is reassuring.  I think he is appropriate for outpatient follow-up.   [PR]    Clinical Course User Index [PR] Willy Eddyobinson, Mylea Roarty, MD    The patient was evaluated in Emergency Department today for the symptoms described in the history of present illness. He/she was evaluated in the context of the global COVID-19 pandemic, which necessitated consideration that the patient might be at risk for infection with the SARS-CoV-2 virus that causes COVID-19. Institutional protocols and algorithms that pertain to the evaluation of patients at risk for COVID-19 are in a state of rapid change based on information released by regulatory bodies including the CDC and federal and state organizations. These policies and algorithms were followed during the patient's care in the ED.  As part of my medical decision making, I reviewed the following data within the electronic MEDICAL RECORD NUMBER Nursing notes reviewed and incorporated, Labs reviewed, notes from prior ED visits and [place]  Controlled Substance Database   ____________________________________________   FINAL CLINICAL IMPRESSION(S) / ED DIAGNOSES  Final diagnoses:  Dehydration      NEW MEDICATIONS STARTED DURING THIS VISIT:  New Prescriptions   METOCLOPRAMIDE (REGLAN) 10 MG TABLET    Take 1 tablet (10 mg total) by mouth every 8 (eight) hours as needed for nausea.     Note:  This document was prepared using Dragon voice recognition software and may include unintentional dictation errors.    Willy Eddyobinson, Ritesh Opara, MD 05/01/19 [phone]

## 2019-05-01 NOTE — Discharge Instructions (Addendum)
IMPRESSION: 1. No CT evidence for acute intra-abdominal or pelvic abnormality. Negative for bowel obstruction. 2. Hepatic steatosis 3. Slightly thick-walled urinary bladder, questionable for cystitis 4. 11 mm slightly complex cyst mid left kidney. This may be evaluated with nonemergent renal CT or MRI.     Electronically Signed   By: Donavan Foil M.D.   On: 05/01/2019 15:02

## 2019-05-01 NOTE — ED Notes (Signed)
Drinks 2 forty ounce beers per day, last drink last night.

## 2019-05-01 NOTE — ED Notes (Signed)
Patient transported to CT 

## 2019-05-01 NOTE — ED Triage Notes (Signed)
Says stomach not right for 3 days.  Bloating, dry heaving, no appetite.  Denies any respiratory symptoms or fever.

## 2019-05-01 NOTE — ED Triage Notes (Addendum)
Reports nausea and diarrhea X 3 days, "stomach doesn't feel right". Denies pain. Pt alert and oriented X4, cooperative, RR even and unlabored, color WNL. Pt in NAD. Poor PO intake over last few days, drinks daily and has been cutting back on drinking due to not feeling well. Denies any sx of withdrawal.

## 2020-10-21 ENCOUNTER — Emergency Department
Admission: EM | Admit: 2020-10-21 | Discharge: 2020-10-21 | Discharge status: Against Medical Advice | Payer: 59 | Attending: Emergency Medicine | Admitting: Emergency Medicine

## 2020-10-21 ENCOUNTER — Encounter: Payer: Self-pay | Admitting: Emergency Medicine

## 2020-10-21 ENCOUNTER — Other Ambulatory Visit: Payer: Self-pay

## 2020-10-21 DIAGNOSIS — R5383 Other fatigue: Secondary | ICD-10-CM | POA: Insufficient documentation

## 2020-10-21 DIAGNOSIS — R112 Nausea with vomiting, unspecified: Secondary | ICD-10-CM | POA: Diagnosis not present

## 2020-10-21 DIAGNOSIS — F17200 Nicotine dependence, unspecified, uncomplicated: Secondary | ICD-10-CM | POA: Insufficient documentation

## 2020-10-21 DIAGNOSIS — F1420 Cocaine dependence, uncomplicated: Secondary | ICD-10-CM | POA: Diagnosis present

## 2020-10-21 DIAGNOSIS — F32A Depression, unspecified: Secondary | ICD-10-CM

## 2020-10-21 DIAGNOSIS — R630 Anorexia: Secondary | ICD-10-CM | POA: Insufficient documentation

## 2020-10-21 DIAGNOSIS — F1020 Alcohol dependence, uncomplicated: Secondary | ICD-10-CM | POA: Diagnosis not present

## 2020-10-21 LAB — COMPREHENSIVE METABOLIC PANEL
ALT: 36 U/L (ref 0–44)
AST: 88 U/L — ABNORMAL HIGH (ref 15–41)
Albumin: 3.4 g/dL — ABNORMAL LOW (ref 3.5–5.0)
Alkaline Phosphatase: 61 U/L (ref 38–126)
Anion gap: 15 (ref 5–15)
BUN: 10 mg/dL (ref 6–20)
CO2: 18 mmol/L — ABNORMAL LOW (ref 22–32)
Calcium: 7.9 mg/dL — ABNORMAL LOW (ref 8.9–10.3)
Chloride: 101 mmol/L (ref 98–111)
Creatinine, Ser: 0.87 mg/dL (ref 0.61–1.24)
GFR, Estimated: >60 mL/min (ref >60)
Glucose, Bld: 153 mg/dL — ABNORMAL HIGH (ref 70–99)
Potassium: 3.9 mmol/L (ref 3.5–5.1)
Sodium: 134 mmol/L — ABNORMAL LOW (ref 135–145)
Total Bilirubin: 0.9 mg/dL (ref 0.3–1.2)
Total Protein: 7 g/dL (ref 6.5–8.1)

## 2020-10-21 LAB — CBC WITH DIFFERENTIAL/PLATELET
Abs Immature Granulocytes: 0.01 10*3/uL (ref 0.00–0.07)
Basophils Absolute: 0 10*3/uL (ref 0.0–0.1)
Basophils Relative: 1 %
Eosinophils Absolute: 0.1 10*3/uL (ref 0.0–0.5)
Eosinophils Relative: 1 %
HCT: 46.1 % (ref 39.0–52.0)
Hemoglobin: 16.2 g/dL (ref 13.0–17.0)
Immature Granulocytes: 0 %
Lymphocytes Relative: 43 %
Lymphs Abs: 2.8 10*3/uL (ref 0.7–4.0)
MCH: 32.2 pg (ref 26.0–34.0)
MCHC: 35.1 g/dL (ref 30.0–36.0)
MCV: 91.7 fL (ref 80.0–100.0)
Monocytes Absolute: 0.7 10*3/uL (ref 0.1–1.0)
Monocytes Relative: 10 %
Neutro Abs: 3 10*3/uL (ref 1.7–7.7)
Neutrophils Relative %: 45 %
Platelets: 229 10*3/uL (ref 150–400)
RBC: 5.03 MIL/uL (ref 4.22–5.81)
RDW: 13.7 % (ref 11.5–15.5)
WBC: 6.5 10*3/uL (ref 4.0–10.5)
nRBC: 0 % (ref 0.0–0.2)

## 2020-10-21 LAB — URINALYSIS, COMPLETE (UACMP) WITH MICROSCOPIC
Bilirubin Urine: NEGATIVE
Glucose, UA: NEGATIVE mg/dL
Ketones, ur: NEGATIVE mg/dL
Leukocytes,Ua: NEGATIVE
Nitrite: NEGATIVE
Protein, ur: NEGATIVE mg/dL
Specific Gravity, Urine: 1.016 (ref 1.005–1.030)
Squamous Epithelial / HPF: NONE SEEN (ref 0–5)
pH: 5 (ref 5.0–8.0)

## 2020-10-21 LAB — TSH: TSH: 1.755 u[IU]/mL (ref 0.350–4.500)

## 2020-10-21 LAB — LIPASE, BLOOD: Lipase: 44 U/L (ref 11–51)

## 2020-10-21 LAB — TROPONIN I (HIGH SENSITIVITY): Troponin I (High Sensitivity): 7 ng/L (ref <18)

## 2020-10-21 MED ORDER — ONDANSETRON 4 MG PO TBDP
4.0000 mg | ORAL_TABLET | Freq: Once | ORAL | Status: AC
  Administered 2020-10-21: 4 mg via ORAL
  Filled 2020-10-21: qty 1

## 2020-10-21 NOTE — ED Notes (Signed)
Pt states he feels bad and wants to leave, states the EDP told him he was good to go. Pt informed to wait for discharge paperwork.

## 2020-10-21 NOTE — ED Notes (Signed)
Pt states he "feels really bad," denies CP, SOB, pain, c/o general malaise. Pt redirected back to bed, instructed on use of urinal for sample.

## 2020-10-21 NOTE — ED Notes (Signed)
Pt signed AMA form at this time.

## 2020-10-21 NOTE — BH Assessment (Signed)
Comprehensive Clinical Assessment (CCA) Note  10/21/2020 Eric Booker [id number]   Recommendations for Services/Supports/Treatments: Awaiting disposition  The patient demonstrates the following risk factors for suicide: Chronic risk factors for suicide include: psychiatric disorder of Alcohol-Induced Depressive Disorder, with moderate use disorder, substance use disorder and demographic factors (male, 60 y/o). Acute risk factors for suicide include: family or marital conflict and social withdrawal/isolation. Protective factors for this patient include: positive social support. Considering these factors, the overall suicide risk at this point appears to be none. Patient is appropriate for outpatient follow up.  Therefore, the C-SSRS has determined "no risk," which requires no sitter for suicide precautions.  Flowsheet Row ED from 10/21/2020 in [facility] EMERGENCY DEPARTMENT  C-SSRS RISK CATEGORY No Risk      Chief Complaint:  Chief Complaint  Patient presents with  . Emesis   Visit Diagnosis: F10.24, Alcohol-induced depressive disorder, With moderate use disorder; F10.20, Alcohol use disorder, Moderate  CCA Screening, Triage and Referral (STR) Eric Booker is a 60 year old patient who voluntarily presents to the [facility] ED due to an inability to focus at work, loss of appetite, emesis 2x/day (which he states has been going on for "a while"), and loss of interest in things he typically enjoys. Pt states to clinician, "I'm fucking losing it - I'm not my fucking self."  Pt states, "Usually I'm a go-getter at work. I have no drive. No appetite. I've got support at work but I'm lonely, but that's nothing new." Pt shares he woke up on Monday not feeling like himself; he states he went to work and had to leave early due to an inability to focus. He states that on Tuesday he experienced the same feelings and barely made it until 1000 before he had to leave. Pt states he slept  from 2000 last night - 0800 today (12 hours), which is also unlike him. Pt shares he typically smokes cigarettes and drinks 2 40-ounce beers/day, but he states he has not been enjoying that the last 3 days and that last night he only drank 1 40-ounce beer. He states he hasn't been doing his laundry or shaving and that he'll "cry at anything."  Pt denies a medical history that could be causing difficulties, stating he doesn't like to take medication. He states he was taking Metamucil for a month and that he stopped taking it a couple of weeks ago; he states he was experiencing intestinal problems and that he's feeling better now.  Pt identifies that he's been experiencing SI, though he states, "I would never do it - only as a last resort;" he denies he's experienced SI in the past. Pt denies he's ever attempted to kill himself or that he currently has a plan to kill himself. Pt denies he's ever been to the hospital for mental health concerns.  Pt denies HI, AVH (though he states he sometimes sees things out of the corner of his eye), NSSIB, access to guns/weapons, engagement with the legal system, or the use of substances other than EtOH.  Pt is oriented x5. His recent/remote memory is intact. Pt was cooperative throughout the assessment process, though he was tearful at times. Pt's insight, judgement, and impulse control is fair at this time.   Patient Reported Information How did you hear about [place]? Self  Referral name: Self  Referral phone number: 0 (N/A)   Whom do you see for routine medical problems? I don't have a doctor  Practice/Facility Name: No data recorded Practice/Facility Phone  Number: No data recorded Name of Contact: No data recorded Contact Number: No data recorded Contact Fax Number: No data recorded Prescriber Name: No data recorded Prescriber Address (if known): No data recorded  What Is the Reason for Your Visit/Call Today? Pt shares he has not felt himself for 3 days.  He states he left work early on Monday and Tuesday and did not go in today. Pt states he has not been doing his laundry or shaving and that he will "cry at anything."  How Long Has This Been Causing You Problems? <Week  What Do You Feel Would Help You the Most Today? Treatment for Depression or other mood problem; Alcohol or Drug Use Treatment   Have You Recently Been in Any Inpatient Treatment (Hospital/Detox/Crisis Center/28-Day Program)? No  Name/Location of Program/Hospital:No data recorded How Long Were You There? No data recorded When Were You Discharged? No data recorded  Have You Ever Received Services From [facility] Before? Yes  Who Do You See at [facility]? Pt has been seen in the ED twice for dehydration and once for EtOH dependence.   Have You Recently Had Any Thoughts About Hurting Yourself? Yes  Are You Planning to Commit Suicide/Harm Yourself At This time? No   Have you Recently Had Thoughts About Hurting Someone Eric Booker? No  Explanation: No data recorded  Have You Used Any Alcohol or Drugs in the Past 24 Hours? Yes  How Long Ago Did You Use Drugs or Alcohol? No data recorded What Did You Use and How Much? Pt states he drank a 40-ounce beer yesterday.   Do You Currently Have a Therapist/Psychiatrist? No  Name of Therapist/Psychiatrist: No data recorded  Have You Been Recently Discharged From Any Office Practice or Programs? No  Explanation of Discharge From Practice/Program: No data recorded    CCA Screening Triage Referral Assessment Type of Contact: Face-to-Face  Is this Initial or Reassessment? No data recorded Date Telepsych consult ordered in [facility]:  No data recorded Time Telepsych consult ordered in [facility]:  No data recorded  Patient Reported Information Reviewed? Yes  Patient Left Without Being Seen? No data recorded Reason for Not Completing Assessment: No data recorded  Collateral Involvement: Pt declined to provide verbal consent for clinician  to make contact with friends/family for collateral.   Does Patient Have a Court Appointed Legal Guardian? No data recorded Name and Contact of Legal Guardian: No data recorded If Minor and Not Living with Parent(s), Who has Custody? N/A  Is CPS involved or ever been involved? Never  Is APS involved or ever been involved? Never   Patient Determined To Be At Risk for Harm To Self or Others Based on Review of Patient Reported Information or Presenting Complaint? No  Method: No data recorded Availability of Means: No data recorded Intent: No data recorded Notification Required: No data recorded Additional Information for Danger to Others Potential: No data recorded Additional Comments for Danger to Others Potential: No data recorded Are There Guns or Other Weapons in Your Home? No data recorded Types of Guns/Weapons: No data recorded Are These Weapons Safely Secured?                            No data recorded Who Could Verify You Are Able To Have These Secured: No data recorded Do You Have any Outstanding Charges, Pending Court Dates, Parole/Probation? No data recorded Contacted To Inform of Risk of Harm To Self or Others: -- (  N/A)   Location of Assessment: [facility]   Does Patient Present under Involuntary Commitment? No  IVC Papers Initial File Date: No data recorded  [place] of Residence: [place]   Patient Currently Receiving the Following Services: Not Receiving Services   Determination of Need: Urgent (48 hours)   Options For Referral: Medication Management; Outpatient Therapy     CCA Biopsychosocial Intake/Chief Complaint:  Pt shares he has not felt himself for 3 days. He states he left work early on Monday and Tuesday and did not go in today. Pt states he has not been doing his laundry or shaving and that he will "cry at anything."  Current Symptoms/Problems: Pt shares he has been experiencing a lack of interest in things he typically enjoys, no appetite, and an  ability to focus at work.   Patient Reported Schizophrenia/Schizoaffective Diagnosis in Past: No   Strengths: Not assessed  Preferences: Not assessed  Abilities: Not assessed   Type of Services Patient Feels are Needed: Pt is unsure of the services he needs at this time.   Initial Clinical Notes/Concerns: N/A   Mental Health Symptoms Depression:  Change in energy/activity; Difficulty Concentrating; Hopelessness; Increase/decrease in appetite; Sleep (too much or little); Tearfulness   Duration of Depressive symptoms: Less than two weeks   Mania:  None   Anxiety:   None   Psychosis:  None   Duration of Psychotic symptoms: No data recorded  Trauma:  None   Obsessions:  None   Compulsions:  None   Inattention:  None   Hyperactivity/Impulsivity:  N/A   Oppositional/Defiant Behaviors:  None   Emotional Irregularity:  Mood lability   Other Mood/Personality Symptoms:  None noted    Mental Status Exam Appearance and self-care  Stature:  Average   Weight:  Thin   Clothing:  Disheveled   Grooming:  Normal   Cosmetic use:  None   Posture/gait:  Normal   Motor activity:  Not Remarkable   Sensorium  Attention:  Normal   Concentration:  Normal   Orientation:  X5   Recall/memory:  Normal   Affect and Mood  Affect:  Depressed   Mood:  Depressed   Relating  Eye contact:  Avoided   Facial expression:  Depressed   Attitude toward examiner:  Cooperative   Thought and Language  Speech flow: Clear and Coherent   Thought content:  Appropriate to Mood and Circumstances   Preoccupation:  None   Hallucinations:  None   Organization:  No data recorded  [employer] of Knowledge:  Average   Intelligence:  Average   Abstraction:  Normal   Judgement:  Fair   Dance movement psychotherapist:  Realistic   Insight:  Fair   Decision Making:  Normal   Social Functioning  Social Maturity:  Responsible   Social Judgement:  Normal   Stress   Stressors:  Family conflict; Illness   Coping Ability:  Normal   Skill Deficits:  Self-control   Supports:  Friends/Service system     Religion: Religion/Spirituality Are You A Religious Person?:  (Not assessed) How Might This Affect Treatment?: Not assessed  Leisure/Recreation:    Exercise/Diet: Exercise/Diet Do You Exercise?:  (Not assessed) Have You Gained or Lost A Significant Amount of Weight in the Past Six Months?:  (Not assessed) Do You Follow a Special Diet?: No Type of Diet: No, but pt has had no appetite for several days Do You Have Any Trouble Sleeping?: Yes Explanation of Sleeping Difficulties: Pt states  he slept from 2000 - 0800 (12 hours) last night, which he states is not normal for him.   CCA Employment/Education Employment/Work Situation: Employment / Work Situation Employment situation: Employed Where is patient currently employed?: [employer] long has patient been employed?: 10 years Patient's job has been impacted by current illness: Yes Describe how patient's job has been impacted: Pt left work early on Monday and Tuesday and did not go into work today. What is the longest time patient has a held a job?: Not assessed Where was the patient employed at that time?: Not assessed Has patient ever been in the [employer]?:  (Not assessed)  Education: Education Is Patient Currently Attending School?: No Last Grade Completed:  (Not assessed) Name of High School: Not assessed Did Garment/textile technologist From [employer]?:  (Not assessed) Did You Attend College?:  (Not assessed) Did You Attend Graduate School?:  (Not assessed) Did You Have Any Special Interests In School?: Not assessed Did You Have An Individualized Education Program (IIEP):  (Not assessed) Did You Have Any Difficulty At School?:  (Not assessed) Patient's Education Has Been Impacted by Current Illness:  (Not assessed)   CCA Family/Childhood History Family and Relationship History: Family  history Marital status: Divorced Divorced, when?: Not assessed What types of issues is patient dealing with in the relationship?: Not assessed Additional relationship information: Not assessed Are you sexually active?:  (Not assessed) What is your sexual orientation?: Not assessed Has your sexual activity been affected by drugs, alcohol, medication, or emotional stress?: Not assessed Does patient have children?: Yes How many children?: 1 How is patient's relationship with their children?: Pt's daughter lives in [place]; he states he has not spoken to her in 10 years.  Childhood History:  Childhood History By whom was/is the patient raised?: Both parents Additional childhood history information: Pt states his parents took in children with severe medical issues when he was a child and that he spent time helping those children by pushing them in their wheelchairs so they could enjoy things that "normal" kids get to enjoy. Description of patient's relationship with caregiver when they were a child: Not assessed Patient's description of current relationship with people who raised him/her: Pt's father died 4 years ago and his mother died "a couple of years ago;" they both lived in [place] at the time of their death. How were you disciplined when you got in trouble as a child/adolescent?: Not assessed Does patient have siblings?: Yes Number of Siblings: 2 Description of patient's current relationship with siblings: Pt's older brother died "a long time ago" at age 59 when he was sleeping in the back of his car at a rest stop; pt states his brother had a heart attack and that he had a hx of epilepsy. Pt speaks to his sister, whom lives in [place], "now-and-then." Did patient suffer any verbal/emotional/physical/sexual abuse as a child?: No Did patient suffer from severe childhood neglect?: No Has patient ever been sexually abused/assaulted/raped as an adolescent or adult?: No Was the patient  ever a victim of a crime or a disaster?: No Witnessed domestic violence?: No Has patient been affected by domestic violence as an adult?: No  Child/Adolescent Assessment:     CCA Substance Use Alcohol/Drug Use: Alcohol / Drug Use Pain Medications: See MAR Prescriptions: See MAR Over the Counter: See MAR History of alcohol / drug use?: Yes Longest period of sobriety (when/how long): Unknown Withdrawal Symptoms: Weakness,Sweats,Fever / Chills,Tremors,Nausea / Vomiting Substance #1 Name of Substance 1: EtOH 1 -  Age of First Use: Unknown 1 - Amount (size/oz): 2 40-ounce beers 1 - Frequency: Daily 1 - Duration: Unknown 1 - Last Use / Amount: Yesterday 1 - Method of Aquiring: Purchase 1- Route of Use: Oral                       ASAM's:  Six Dimensions of Multidimensional Assessment  Dimension 1:  Acute Intoxication and/or Withdrawal Potential:   Dimension 1:  Description of individual's past and current experiences of substance use and withdrawal: Pt's prior hx of w/d symptoms include fever/chills, nausea/vomiting, sweats.  Dimension 2:  Biomedical Conditions and Complications:   Dimension 2:  Description of patient's biomedical conditions and  complications: None noted  Dimension 3:  Emotional, Behavioral, or Cognitive Conditions and Complications:  Dimension 3:  Description of emotional, behavioral, or cognitive conditions and complications: Pt has been experiencing depression the last 3 days.  Dimension 4:  Readiness to Change:  Dimension 4:  Description of Readiness to Change criteria: Pt has a hx of not following through with treatment.  Dimension 5:  Relapse, Continued use, or Continued Problem Potential:  Dimension 5:  Relapse, continued use, or continued problem potential critiera description: Pt expresses no concern re: needing to quit EtOH abuse at this time.  Dimension 6:  Recovery/Living Environment:  Dimension 6:  Recovery/Iiving environment criteria description:  Pt lives independently.  ASAM Severity Score: ASAM's Severity Rating Score: 8  ASAM Recommended Level of Treatment: ASAM Recommended Level of Treatment: Level I Outpatient Treatment   Substance use Disorder (SUD) Substance Use Disorder (SUD)  Checklist Symptoms of Substance Use: Evidence of tolerance,Evidence of withdrawal (Comment),Persistent desire or unsuccessful efforts to cut down or control use (Pt has experienced fever/chills, sweats, and tremors when experiencing detox.)  Recommendations for Services/Supports/Treatments: Recommendations for Services/Supports/Treatments Recommendations For Services/Supports/Treatments: Medication Management,Individual Therapy  Awaiting disposition  DSM5 Diagnoses: F10.24, Alcohol-induced depressive disorder, With moderate use disorder; F10.20, Alcohol use disorder, Moderate  Patient Centered Plan: Patient is on the following Treatment Plan(s):  Depression and Substance Abuse   Referrals to Alternative Service(s): Referred to Alternative Service(s):   Place:   Date:   Time:    Referred to Alternative Service(s):   Place:   Date:   Time:    Referred to Alternative Service(s):   Place:   Date:   Time:    Referred to Alternative Service(s):   Place:   Date:   Time:     Ralph DowdySamantha L Teddi Badalamenti, LMFT

## 2020-10-21 NOTE — ED Notes (Signed)
TTS with pt  

## 2020-10-21 NOTE — ED Provider Notes (Signed)
[facility] Emergency Department Provider Note  Time seen: 11:44 AM  I have reviewed the triage vital signs and the nursing notes.   HISTORY  Chief Complaint Emesis   HPI Eric Booker is a 60 y.o. male with no significant past medical history presents to the emergency department with vague complaints.  According to the patient over the past week or so he has "just not been feeling it."  States he is going to working having to leave work because he cannot stay focused and "is not feeling it."  Patient states he has been nauseated with occasional episodes of vomiting over the past week as well.  States he has not had any appetite he is not sleeping well.  States he does drink occasional alcohol but denies any drug use.  Patient is tearful at times, says he thinks this is all "mental."  Patient states he has never been diagnosed with depression has never seen a psychiatrist or therapist for any reason.   History reviewed. No pertinent past medical history.  There are no problems to display for this patient.   History reviewed. No pertinent surgical history.  Prior to Admission medications   Medication Sig Start Date End Date Taking? Authorizing Provider  metoCLOPramide (REGLAN) 10 MG tablet Take 1 tablet (10 mg total) by mouth every 8 (eight) hours as needed for nausea. 05/01/19 04/30/20  Willy Eddy, MD    Allergies  Allergen Reactions  . Bee Venom Swelling    History reviewed. No pertinent family history.  Social History Social History   Tobacco Use  . Smoking status: Current Every Day Smoker  . Smokeless tobacco: Current User  Substance Use Topics  . Alcohol use: Yes    Alcohol/week: 12.0 standard drinks    Types: 12 Cans of beer per week    Review of Systems Constitutional: Negative for fever. Cardiovascular: Negative for chest pain. Respiratory: Negative for shortness of breath. Gastrointestinal: Negative for abdominal pain  intermittent nausea vomiting.  Decreased appetite. Genitourinary: Negative for urinary compaints Musculoskeletal: Negative for musculoskeletal complaints Neurological: Negative for headache All other ROS negative  ____________________________________________   PHYSICAL EXAM:  VITAL SIGNS: ED Triage Vitals  Enc Vitals Group     BP 10/21/20 1128 (!) 153/92     Pulse Rate 10/21/20 1128 67     Resp 10/21/20 1128 20     Temp 10/21/20 1128 98.3 F (36.8 C)     Temp Source 10/21/20 1128 Oral     SpO2 10/21/20 1128 96 %     Weight 10/21/20 1127 180 lb (81.6 kg)     Height 10/21/20 1127 6' (1.829 m)     Head Circumference --      Peak Flow --      Pain Score 10/21/20 1126 0     Pain Loc --      Pain Edu? --      Excl. in GC? --     Constitutional: Alert and oriented.  Overall well-appearing but tearful at times. Eyes: Normal exam ENT      Head: Normocephalic and atraumatic.      Mouth/Throat: Mucous membranes are moist. Cardiovascular: Normal rate, regular rhythm.  Respiratory: Normal respiratory effort without tachypnea nor retractions. Breath sounds are clear  Gastrointestinal: Soft and nontender. No distention.   Musculoskeletal: Nontender with normal range of motion in all extremities. Neurologic:  Normal speech and language. No gross focal neurologic deficits Skin:  Skin is warm Psychiatric: Patient is tearful at  times throughout his evaluation.  States he lives alone and thinks this could all be "mental."  ____________________________________________    EKG  EKG viewed and interpreted by myself shows sinus tachycardia 106 bpm with a slightly widened QRS, right axis deviation, slight QTC prolongation otherwise normal intervals with nonspecific ST changes.  ____________________________________________   INITIAL IMPRESSION / ASSESSMENT AND PLAN / ED COURSE  Pertinent labs & imaging results that were available during my care of the patient were reviewed by me and  considered in my medical decision making (see chart for details).   Patient presents emergency department with vague complaints including nausea, lack of appetite, fatigue, lack of engagement.  Patient says he thinks this could be depression but specifically denies any thoughts of hurting himself or anyone else.  I did offer to have a psychiatrist see the patient and patient would like to speak to a psychiatrist.  Given the patient's symptoms however I do believe a medical work-up is warranted.  EKG is atypical we will add on cardiac enzymes as well.  We will continue to closely monitor the patient while awaiting lab results and psychiatric consultation.  Patient remains here on a voluntary basis.  His medical work-up is largely nonrevealing including a negative troponin.  Normal TSH.  Urinalysis is normal and lab work is largely within normal limits.  However given the patient's symptoms suggesting depression I did offer psychiatric consultation which the patient would like to see.  However while I was in another patient's room the patient has eloped from the emergency department.  They were able to stop the patient in the waiting room but as he is not under IVC patient opted to leave without psychiatric consultation.  Eric Booker was evaluated in Emergency Department on 10/21/2020 for the symptoms described in the history of present illness. He was evaluated in the context of the global COVID-19 pandemic, which necessitated consideration that the patient might be at risk for infection with the SARS-CoV-2 virus that causes COVID-19. Institutional protocols and algorithms that pertain to the evaluation of patients at risk for COVID-19 are in a state of rapid change based on information released by regulatory bodies including the CDC and federal and state organizations. These policies and algorithms were followed during the patient's care in the ED.  ____________________________________________   FINAL  CLINICAL IMPRESSION(S) / ED DIAGNOSES  Fatigue Decreased appetite Nausea vomiting Depression   Minna Antis, MD 10/21/20 1436

## 2020-10-21 NOTE — ED Notes (Signed)
Pt walking out of flex at this time.

## 2020-10-21 NOTE — ED Triage Notes (Signed)
Pt to ED via POV with c/o emesis, and "something in my mind ain't right". Pt denies depression, denies thoughts of wanting to hurt himself. Pt denies pain at this time. Pt states loss of appetite and intermittent emesis. Pt repeatedly stating loss of interest.

## 2020-10-29 ENCOUNTER — Other Ambulatory Visit: Payer: Self-pay

## 2020-10-29 ENCOUNTER — Inpatient Hospital Stay
Admission: EM | Admit: 2020-10-29 | Discharge: 2020-10-30 | DRG: 640 | Disposition: A | Discharge status: Home / Self Care | Payer: 59 | Attending: Internal Medicine | Admitting: Internal Medicine

## 2020-10-29 DIAGNOSIS — R7989 Other specified abnormal findings of blood chemistry: Secondary | ICD-10-CM | POA: Diagnosis present

## 2020-10-29 DIAGNOSIS — F17210 Nicotine dependence, cigarettes, uncomplicated: Secondary | ICD-10-CM | POA: Diagnosis present

## 2020-10-29 DIAGNOSIS — E871 Hypo-osmolality and hyponatremia: Principal | ICD-10-CM | POA: Diagnosis present

## 2020-10-29 DIAGNOSIS — K7010 Alcoholic hepatitis without ascites: Secondary | ICD-10-CM | POA: Diagnosis present

## 2020-10-29 DIAGNOSIS — D6959 Other secondary thrombocytopenia: Secondary | ICD-10-CM | POA: Diagnosis present

## 2020-10-29 DIAGNOSIS — Z20822 Contact with and (suspected) exposure to covid-19: Secondary | ICD-10-CM | POA: Diagnosis present

## 2020-10-29 DIAGNOSIS — R112 Nausea with vomiting, unspecified: Secondary | ICD-10-CM | POA: Diagnosis present

## 2020-10-29 DIAGNOSIS — Z5329 Procedure and treatment not carried out because of patient's decision for other reasons: Secondary | ICD-10-CM | POA: Diagnosis not present

## 2020-10-29 DIAGNOSIS — G9341 Metabolic encephalopathy: Secondary | ICD-10-CM | POA: Diagnosis present

## 2020-10-29 DIAGNOSIS — Z91030 Bee allergy status: Secondary | ICD-10-CM | POA: Diagnosis not present

## 2020-10-29 DIAGNOSIS — F10230 Alcohol dependence with withdrawal, uncomplicated: Secondary | ICD-10-CM | POA: Diagnosis present

## 2020-10-29 DIAGNOSIS — F10930 Alcohol use, unspecified with withdrawal, uncomplicated: Secondary | ICD-10-CM

## 2020-10-29 DIAGNOSIS — K760 Fatty (change of) liver, not elsewhere classified: Secondary | ICD-10-CM | POA: Diagnosis present

## 2020-10-29 DIAGNOSIS — E860 Dehydration: Secondary | ICD-10-CM | POA: Diagnosis present

## 2020-10-29 DIAGNOSIS — F10231 Alcohol dependence with withdrawal delirium: Secondary | ICD-10-CM | POA: Diagnosis not present

## 2020-10-29 DIAGNOSIS — R109 Unspecified abdominal pain: Secondary | ICD-10-CM

## 2020-10-29 DIAGNOSIS — E876 Hypokalemia: Secondary | ICD-10-CM | POA: Diagnosis present

## 2020-10-29 LAB — URINALYSIS, COMPLETE (UACMP) WITH MICROSCOPIC
Bacteria, UA: NONE SEEN
Bilirubin Urine: NEGATIVE
Glucose, UA: NEGATIVE mg/dL
Ketones, ur: 5 mg/dL — AB
Leukocytes,Ua: NEGATIVE
Nitrite: NEGATIVE
Protein, ur: NEGATIVE mg/dL
Specific Gravity, Urine: 1.006 (ref 1.005–1.030)
Squamous Epithelial / HPF: NONE SEEN (ref 0–5)
pH: 6 (ref 5.0–8.0)

## 2020-10-29 LAB — COMPREHENSIVE METABOLIC PANEL
ALT: 149 U/L — ABNORMAL HIGH (ref 0–44)
ALT: 129 U/L — ABNORMAL HIGH (ref 0–44)
AST: 557 U/L — ABNORMAL HIGH (ref 15–41)
AST: 466 U/L — ABNORMAL HIGH (ref 15–41)
Albumin: 3.3 g/dL — ABNORMAL LOW (ref 3.5–5.0)
Albumin: 2.7 g/dL — ABNORMAL LOW (ref 3.5–5.0)
Alkaline Phosphatase: 87 U/L (ref 38–126)
Alkaline Phosphatase: 74 U/L (ref 38–126)
Anion gap: 16 — ABNORMAL HIGH (ref 5–15)
Anion gap: 9 (ref 5–15)
BUN: 11 mg/dL (ref 6–20)
BUN: 8 mg/dL (ref 6–20)
CO2: 17 mmol/L — ABNORMAL LOW (ref 22–32)
CO2: 21 mmol/L — ABNORMAL LOW (ref 22–32)
Calcium: 8.1 mg/dL — ABNORMAL LOW (ref 8.9–10.3)
Calcium: 7.4 mg/dL — ABNORMAL LOW (ref 8.9–10.3)
Chloride: 91 mmol/L — ABNORMAL LOW (ref 98–111)
Chloride: 97 mmol/L — ABNORMAL LOW (ref 98–111)
Creatinine, Ser: 1.02 mg/dL (ref 0.61–1.24)
Creatinine, Ser: 0.87 mg/dL (ref 0.61–1.24)
GFR, Estimated: >60 mL/min (ref >60)
GFR, Estimated: >60 mL/min (ref >60)
Glucose, Bld: 159 mg/dL — ABNORMAL HIGH (ref 70–99)
Glucose, Bld: 136 mg/dL — ABNORMAL HIGH (ref 70–99)
Potassium: 3.7 mmol/L (ref 3.5–5.1)
Potassium: 3.5 mmol/L (ref 3.5–5.1)
Sodium: 124 mmol/L — ABNORMAL LOW (ref 135–145)
Sodium: 127 mmol/L — ABNORMAL LOW (ref 135–145)
Total Bilirubin: 2.5 mg/dL — ABNORMAL HIGH (ref 0.3–1.2)
Total Bilirubin: 2.1 mg/dL — ABNORMAL HIGH (ref 0.3–1.2)
Total Protein: 6.6 g/dL (ref 6.5–8.1)
Total Protein: 5.6 g/dL — ABNORMAL LOW (ref 6.5–8.1)

## 2020-10-29 LAB — TROPONIN I (HIGH SENSITIVITY)
Troponin I (High Sensitivity): 20 ng/L — ABNORMAL HIGH
Troponin I (High Sensitivity): 28 ng/L — ABNORMAL HIGH (ref <18)
Troponin I (High Sensitivity): 31 ng/L — ABNORMAL HIGH (ref <18)

## 2020-10-29 LAB — CBC
HCT: 40.2 % (ref 39.0–52.0)
HCT: 36.2 % — ABNORMAL LOW (ref 39.0–52.0)
Hemoglobin: 14.8 g/dL (ref 13.0–17.0)
Hemoglobin: 13 g/dL (ref 13.0–17.0)
MCH: 32.5 pg (ref 26.0–34.0)
MCH: 32.3 pg (ref 26.0–34.0)
MCHC: 36.8 g/dL — ABNORMAL HIGH (ref 30.0–36.0)
MCHC: 35.9 g/dL (ref 30.0–36.0)
MCV: 88.2 fL (ref 80.0–100.0)
MCV: 89.8 fL (ref 80.0–100.0)
Platelets: 100 10*3/uL — ABNORMAL LOW (ref 150–400)
Platelets: 86 10*3/uL — ABNORMAL LOW (ref 150–400)
RBC: 4.56 MIL/uL (ref 4.22–5.81)
RBC: 4.03 MIL/uL — ABNORMAL LOW (ref 4.22–5.81)
RDW: 13.2 % (ref 11.5–15.5)
RDW: 13.2 % (ref 11.5–15.5)
WBC: 9.6 10*3/uL (ref 4.0–10.5)
WBC: 5.7 10*3/uL (ref 4.0–10.5)
nRBC: 0 % (ref 0.0–0.2)
nRBC: 0 % (ref 0.0–0.2)

## 2020-10-29 LAB — RESP PANEL BY RT-PCR (FLU A&B, COVID) ARPGX2
Influenza A by PCR: NEGATIVE
Influenza B by PCR: NEGATIVE
SARS Coronavirus 2 by RT PCR: NEGATIVE

## 2020-10-29 LAB — LIPASE, BLOOD: Lipase: 54 U/L — ABNORMAL HIGH (ref 11–51)

## 2020-10-29 LAB — PHOSPHORUS: Phosphorus: 2.7 mg/dL (ref 2.5–4.6)

## 2020-10-29 LAB — MAGNESIUM: Magnesium: 1.4 mg/dL — ABNORMAL LOW (ref 1.7–2.4)

## 2020-10-29 MED ORDER — FOLIC ACID 1 MG PO TABS
1.0000 mg | ORAL_TABLET | Freq: Every day | ORAL | Status: DC
  Administered 2020-10-29 – 2020-10-30 (×2): 1 mg via ORAL
  Filled 2020-10-29 (×2): qty 1

## 2020-10-29 MED ORDER — ALBUTEROL SULFATE (2.5 MG/3ML) 0.083% IN NEBU: 2.5000 mg | INHALATION_SOLUTION | Freq: Four times a day (QID) | RESPIRATORY_TRACT | Status: DC | PRN

## 2020-10-29 MED ORDER — ACETAMINOPHEN 650 MG RE SUPP: 650.0000 mg | Freq: Four times a day (QID) | RECTAL | Status: DC | PRN

## 2020-10-29 MED ORDER — ONDANSETRON HCL 4 MG PO TABS
4.0000 mg | ORAL_TABLET | Freq: Four times a day (QID) | ORAL | Status: DC | PRN
Start: 2020-10-29 — End: 2020-10-30

## 2020-10-29 MED ORDER — SODIUM CHLORIDE 0.9 % IV BOLUS
1000.0000 mL | Freq: Once | INTRAVENOUS | Status: AC
  Administered 2020-10-29: 1000 mL via INTRAVENOUS

## 2020-10-29 MED ORDER — ADULT MULTIVITAMIN W/MINERALS CH
1.0000 | ORAL_TABLET | Freq: Every day | ORAL | Status: DC
  Administered 2020-10-29 – 2020-10-30 (×2): 1 via ORAL
  Filled 2020-10-29 (×2): qty 1

## 2020-10-29 MED ORDER — ZOLPIDEM TARTRATE 5 MG PO TABS: 5.0000 mg | ORAL_TABLET | Freq: Every evening | ORAL | Status: DC | PRN

## 2020-10-29 MED ORDER — LORAZEPAM 1 MG PO TABS: 1.0000 mg | ORAL_TABLET | Freq: Four times a day (QID) | ORAL | Status: DC

## 2020-10-29 MED ORDER — ONDANSETRON HCL 4 MG/2ML IJ SOLN
4.0000 mg | Freq: Once | INTRAMUSCULAR | Status: AC
  Administered 2020-10-29: 4 mg via INTRAVENOUS
  Filled 2020-10-29: qty 2

## 2020-10-29 MED ORDER — IPRATROPIUM BROMIDE 0.02 % IN SOLN: 0.5000 mg | Freq: Four times a day (QID) | RESPIRATORY_TRACT | Status: DC | PRN

## 2020-10-29 MED ORDER — SENNOSIDES-DOCUSATE SODIUM 8.6-50 MG PO TABS: 1.0000 | ORAL_TABLET | Freq: Every evening | ORAL | Status: DC | PRN

## 2020-10-29 MED ORDER — LORAZEPAM 0.5 MG PO TABS: 0.5000 mg | ORAL_TABLET | Freq: Four times a day (QID) | ORAL | Status: DC

## 2020-10-29 MED ORDER — THIAMINE HCL 100 MG PO TABS
100.0000 mg | ORAL_TABLET | Freq: Every day | ORAL | Status: DC
  Administered 2020-10-29 – 2020-10-30 (×2): 100 mg via ORAL
  Filled 2020-10-29 (×2): qty 1

## 2020-10-29 MED ORDER — MAGNESIUM CITRATE PO SOLN: 1.0000 | Freq: Once | ORAL | Status: DC | PRN

## 2020-10-29 MED ORDER — BISACODYL 5 MG PO TBEC: 5.0000 mg | DELAYED_RELEASE_TABLET | Freq: Every day | ORAL | Status: DC | PRN

## 2020-10-29 MED ORDER — LORAZEPAM 2 MG/ML IJ SOLN
1.0000 mg | Freq: Once | INTRAMUSCULAR | Status: AC
  Administered 2020-10-29: 1 mg via INTRAVENOUS
  Filled 2020-10-29: qty 1

## 2020-10-29 MED ORDER — LORAZEPAM 1 MG PO TABS: 1.0000 mg | ORAL_TABLET | ORAL | Status: DC | PRN

## 2020-10-29 MED ORDER — ONDANSETRON HCL 4 MG/2ML IJ SOLN: 4.0000 mg | Freq: Four times a day (QID) | INTRAMUSCULAR | Status: DC | PRN

## 2020-10-29 MED ORDER — LORAZEPAM 2 MG/ML IJ SOLN: 1.0000 mg | INTRAMUSCULAR | Status: DC | PRN

## 2020-10-29 MED ORDER — LORAZEPAM 2 MG PO TABS
2.0000 mg | ORAL_TABLET | Freq: Four times a day (QID) | ORAL | Status: AC
  Administered 2020-10-29 – 2020-10-30 (×4): 2 mg via ORAL
  Filled 2020-10-29 (×4): qty 1

## 2020-10-29 MED ORDER — ACETAMINOPHEN 325 MG PO TABS: 650.0000 mg | ORAL_TABLET | Freq: Four times a day (QID) | ORAL | Status: DC | PRN

## 2020-10-29 MED ORDER — MORPHINE SULFATE (PF) 2 MG/ML IV SOLN: 1.0000 mg | Freq: Four times a day (QID) | INTRAVENOUS | Status: DC | PRN

## 2020-10-29 MED ORDER — THIAMINE HCL 100 MG/ML IJ SOLN: 100.0000 mg | Freq: Every day | INTRAMUSCULAR | Status: DC

## 2020-10-29 NOTE — ED Notes (Signed)
Pt requesting dinner tray, this RN informing patient that he will need to wait for bloodwork/scans to be done before he will be allowed PO intake. Pt verbalized understanding.

## 2020-10-29 NOTE — ED Provider Notes (Signed)
[facility] Emergency Department Provider Note  Time seen: 3:21 PM  I have reviewed the triage vital signs and the nursing notes.   HISTORY  Chief Complaint Emesis   HPI Eric Booker is a 60 y.o. male with no significant past medical history presents emergency department for continued nausea and vomiting.  According to the patient for the past 6 weeks now he has been experiencing nausea and vomiting having difficulty eating and no appetite.  Denies any abdominal pain or chest pain.  Denies any fever cough or shortness of breath.  Patient does admit to alcohol use approximately 2-3 beers per day and last drank beer last night.  Patient.  States he will get shaky when he does not drink.  No past medical history on file.  There are no problems to display for this patient.   No past surgical history on file.  Prior to Admission medications   Medication Sig Start Date End Date Taking? Authorizing Provider  metoCLOPramide (REGLAN) 10 MG tablet Take 1 tablet (10 mg total) by mouth every 8 (eight) hours as needed for nausea. 05/01/19 04/30/20  Willy Eddy, MD    Allergies  Allergen Reactions  . Bee Venom Swelling    No family history on file.  Social History Social History   Tobacco Use  . Smoking status: Current Every Day Smoker  . Smokeless tobacco: Current User  Substance Use Topics  . Alcohol use: Yes    Alcohol/week: 12.0 standard drinks    Types: 12 Cans of beer per week    Review of Systems Constitutional: Negative for fever. Cardiovascular: Negative for chest pain. Respiratory: Negative for shortness of breath. Gastrointestinal: Negative for abdominal pain.  Positive nausea vomiting. Musculoskeletal: Negative for musculoskeletal complaints Neurological: Negative for headache All other ROS negative  ____________________________________________   PHYSICAL EXAM:  VITAL SIGNS: ED Triage Vitals  Enc Vitals Group     BP 10/29/20  1501 (!) 165/106     Pulse Rate 10/29/20 1501 (!) 124     Resp 10/29/20 1501 (!) 22     Temp 10/29/20 1502 98.2 F (36.8 C)     Temp Source 10/29/20 1502 Oral     SpO2 10/29/20 1501 98 %     Weight 10/29/20 1502 179 lb (81.2 kg)     Height 10/29/20 1502 6' (1.829 m)     Head Circumference --      Peak Flow --      Pain Score --      Pain Loc --      Pain Edu? --      Excl. in GC? --    Constitutional: Alert and oriented. Well appearing and in no distress. Eyes: Normal exam ENT      Head: Normocephalic and atraumatic.      Mouth/Throat: Mucous membranes are moist. Cardiovascular: Regular rhythm rate around 120 bpm.  No obvious murmur. Respiratory: Normal respiratory effort without tachypnea nor retractions. Breath sounds are clear  Gastrointestinal: Soft and nontender. No distention.   Musculoskeletal: Nontender with normal range of motion in all extremities. Neurologic:  Normal speech and language. No gross focal neurologic deficits  Skin:  Skin is warm, dry and intact.  Psychiatric: Mood and affect are normal.   ____________________________________________    EKG  EKG viewed and interpreted by myself shows sinus tachycardia 123 bpm with a widened QRS, normal axis, largely normal intervals with nonspecific ST changes including peaked T waves.  However in comparison to the last  EKG is largely unchanged.  ____________________________________________   INITIAL IMPRESSION / ASSESSMENT AND PLAN / ED COURSE  Pertinent labs & imaging results that were available during my care of the patient were reviewed by me and considered in my medical decision making (see chart for details).   Patient presents to the emergency department for nausea vomiting over the past 6 weeks.  Patient states no appetite and is not able to eat or drink much.  Patient however did nearly immediately ask what time dinner will be available.  Patient was seen for the same last week by myself had a negative  work-up at that time wanted to speak to psychiatry but left before he did so.  Overall patient appears well is mildly tremulous and is tachycardic around 120 bpm.  States last alcohol intake was last night.  We will check labs, IV hydrate treat with a milligram of IV Ativan as well as Zofran and reassess.  Patient agreeable to plan of care.  Patient's labs have resulted showing hyponatremia.  We will continue with IV hydration.  Highly suspect alcohol withdrawal as well we will place on CIWA protocols.  Given the hyponatremia with nausea vomiting alcohol withdrawal we will admit to the hospital service for further work-up and treatment.  Patient agreeable  Eric Booker was evaluated in Emergency Department on 10/29/2020 for the symptoms described in the history of present illness. He was evaluated in the context of the global COVID-19 pandemic, which necessitated consideration that the patient might be at risk for infection with the SARS-CoV-2 virus that causes COVID-19. Institutional protocols and algorithms that pertain to the evaluation of patients at risk for COVID-19 are in a state of rapid change based on information released by regulatory bodies including the CDC and federal and state organizations. These policies and algorithms were followed during the patient's care in the ED.  ____________________________________________   FINAL CLINICAL IMPRESSION(S) / ED DIAGNOSES  Nausea vomiting Hyponatremia Alcohol withdrawal   Minna Antis, MD 10/29/20 2347

## 2020-10-29 NOTE — ED Notes (Signed)
ED Provider at bedside. Patient able to eat per MD.

## 2020-10-29 NOTE — ED Triage Notes (Signed)
Pt BIB EMS from PCP, pt complaint of nausea/vomiting "since [place]ricks day." Seen for same last week in ED but states symptoms have worsened. UA completed at PCP today that should protein. Pt endorses RLQ abdominal pain with palpation. Endorses decreased appetite x2 days and near-syncope earlier today.

## 2020-10-29 NOTE — H&P (Signed)
History and Physical   [facility] [place] @ [facility] Admission History and Physical [employer], D.O.    Patient Name: Eric Booker MR#: [id number] Date of Birth: 1960-08-06 Date of Admission: 10/29/2020  Referring MD/NP/PA: Dr. Lenard Lance Primary Care Physician: Pcp, No  Chief Complaint:  Chief Complaint  Patient presents with  . Emesis    HPI: Van Seymore is a 60 y.o. male with known history presents to the emergency department for evaluation of nausea and vomiting.  Patient was in a usual state of health until his ago when he developed difficulty eating, low appetite secondary to nausea and vomiting which is nonbloody nonbilious..  Last drink was two days ago, usually drinks 2 40 ounce beers a day and smokes 1ppd.  No drug use.   Patient denies fevers/chills, weakness, dizziness, chest pain, shortness of breath, dysuria/frequency, changes in mental status.    Otherwise there has been no change in status. Patient has been taking medication as prescribed and there has been no recent change in medication or diet.  No recent antibiotics.  There has been no recent illness, hospitalizations, travel or sick contacts.    EMS/ED Course: Medical admission has been requested for further management of euvolemic hyponatremia, alcohol withdrawal syndrome.  Review of Systems:  CONSTITUTIONAL: No fever/chills, fatigue, weakness, weight gain/loss, headache. EYES: No blurry or double vision. ENT: No tinnitus, postnasal drip, redness or soreness of the oropharynx. RESPIRATORY: No cough, dyspnea, wheeze.  No hemoptysis.  CARDIOVASCULAR: No chest pain, palpitations, syncope, orthopnea. No lower extremity edema.  GASTROINTESTINAL: Positive nausea, vomiting, abdominal pain, negative  diarrhea, constipation.  No hematemesis, melena or hematochezia. GENITOURINARY: No dysuria, frequency, hematuria. ENDOCRINE: No polyuria or nocturia. No heat or cold intolerance. HEMATOLOGY: No  anemia, bruising, bleeding. INTEGUMENTARY: No rashes, ulcers, lesions. MUSCULOSKELETAL: No arthritis, gout, dyspnea. NEUROLOGIC: No numbness, tingling, ataxia, seizure-type activity, weakness. PSYCHIATRIC: No anxiety, depression, insomnia.   Past medical history: Negative Past surgical history: Negative   reports that he has been smoking. He uses smokeless tobacco. He reports current alcohol use of about 12.0 standard drinks of alcohol per week. No history on file for drug use.  Allergies  Allergen Reactions  . Bee Venom Swelling    No family history on file.  Prior to Admission medications   Medication Sig Start Date End Date Taking? Authorizing Provider  metoCLOPramide (REGLAN) 10 MG tablet Take 1 tablet (10 mg total) by mouth every 8 (eight) hours as needed for nausea. 05/01/19 04/30/20  Willy Eddy, MD    Physical Exam: Vitals:   10/29/20 1800 10/29/20 1830 10/29/20 1900 10/29/20 1932  BP: (!) 141/84 124/82 (!) 142/82 (!) 140/96  Pulse:    (!) 110  Resp:    20  Temp:    98.7 F (37.1 C)  TempSrc:    Oral  SpO2:  100%  94%  Weight:      Height:        GENERAL: 60 y.o.-year-old white male patient, well-developed, well-nourished lying in the bed in no acute distress.  Pleasant and cooperative.   HEENT: Head atraumatic, normocephalic. Pupils equal. Mucus membranes moist. NECK: Supple. No JVD. CHEST: Normal breath sounds bilaterally. No wheezing, rales, rhonchi or crackles. No use of accessory muscles of respiration.  No reproducible chest wall tenderness.  CARDIOVASCULAR: S1, S2 normal. No murmurs, rubs, or gallops. Cap refill <2 seconds. Pulses intact distally.  ABDOMEN: Soft, mild midepigasticTTP. No rebound, guarding, rigidity. Normoactive bowel sounds present in all four quadrants.  EXTREMITIES:  No pedal edema, cyanosis, or clubbing. No calf tenderness or Homan's sign.  NEUROLOGIC: Mildly tremulous.  The patient is alert and oriented x 3. Cranial nerves II  through XII are grossly intact with no focal sensorimotor deficit. PSYCHIATRIC:  Normal affect, mood, thought content. SKIN: Warm, dry, and intact without obvious rash, lesion, or ulcer.    Labs on Admission:  CBC: Recent Labs  Lab 10/29/20 1511  WBC 9.6  HGB 14.8  HCT 40.2  MCV 88.2  PLT 100*   Basic Metabolic Panel: Recent Labs  Lab 10/29/20 1511  NA 124*  K 3.7  CL 91*  CO2 17*  GLUCOSE 159*  BUN 11  CREATININE 1.02  CALCIUM 8.1*   GFR: Estimated Creatinine Clearance: 85.6 mL/min (by C-G formula based on SCr of 1.02 mg/dL). Liver Function Tests: Recent Labs  Lab 10/29/20 1511  AST 557*  ALT 149*  ALKPHOS 87  BILITOT 2.5*  PROT 6.6  ALBUMIN 3.3*   Recent Labs  Lab 10/29/20 1511  LIPASE 54*   No results for input(s): AMMONIA in the last 168 hours. Coagulation Profile: No results for input(s): INR, PROTIME in the last 168 hours. Cardiac Enzymes: No results for input(s): CKTOTAL, CKMB, CKMBINDEX, TROPONINI in the last 168 hours. BNP (last 3 results) No results for input(s): PROBNP in the last 8760 hours. HbA1C: No results for input(s): HGBA1C in the last 72 hours. CBG: No results for input(s): GLUCAP in the last 168 hours. Lipid Profile: No results for input(s): CHOL, HDL, LDLCALC, TRIG, CHOLHDL, LDLDIRECT in the last 72 hours. Thyroid Function Tests: No results for input(s): TSH, T4TOTAL, FREET4, T3FREE, THYROIDAB in the last 72 hours. Anemia Panel: No results for input(s): VITAMINB12, FOLATE, FERRITIN, TIBC, IRON, RETICCTPCT in the last 72 hours. Urine analysis:    Component Value Date/Time   COLORURINE YELLOW (A) 10/21/2020 1131   APPEARANCEUR HAZY (A) 10/21/2020 1131   APPEARANCEUR Clear 01/20/2013 1349   LABSPEC 1.016 10/21/2020 1131   LABSPEC 1.009 01/20/2013 1349   PHURINE 5.0 10/21/2020 1131   GLUCOSEU NEGATIVE 10/21/2020 1131   GLUCOSEU Negative 01/20/2013 1349   HGBUR SMALL (A) 10/21/2020 1131   BILIRUBINUR NEGATIVE 10/21/2020 1131    BILIRUBINUR Negative 01/20/2013 1349   KETONESUR NEGATIVE 10/21/2020 1131   PROTEINUR NEGATIVE 10/21/2020 1131   NITRITE NEGATIVE 10/21/2020 1131   LEUKOCYTESUR NEGATIVE 10/21/2020 1131   LEUKOCYTESUR Negative 01/20/2013 1349   Sepsis Labs: @LABRCNTIP (procalcitonin:4,lacticidven:4) )No results found for this or any previous visit (from the past 240 hour(s)).   Radiological Exams on Admission: No results found.  EKG: Sinus tachycardia at 123 bpm with normal axis, peaked T waves and nonspecific ST-T wave changes.   Assessment/Plan  This is a 60 y.o. male with no significant past medical history now being admitted with:  #.  Nausea and vomiting with elevated troponin, questionable atypical presentation of ACS -Admit to inpatient, telemetry -Trend troponins, check TSH and lipids - Check right upper quadrant abdominal ultrasound given elevated LFTs and bilirubin, evaluate for cholelithiasis/pancreatitis -Antiemetics as needed  #.  Euvolemic hyponatremia likely secondary to above - Fluid restrict  #.  Alcohol use disorder with withdrawal - CIWA protocol - Oral Ativan taper over the next 3 days - Multivitamin, folic acid and thiamine -Fall and seizure precautions  Admission status: Inpatient IV Fluids: Hep-Lock Diet/Nutrition: Clear liquid advance as tolerated Consults called: None DVT Px: SCDs and early ambulation. Code Status: Full Code  Disposition Plan: To home in 1-2 days  All the records are  reviewed and case discussed with ED provider. Management plans discussed with the patient and/or family who express understanding and agree with plan of care.  Emmajane Altamura D.O. on 10/29/2020 at 7:42 PM CC: Primary care physician; Pcp, No   10/29/2020, 7:42 PM

## 2020-10-30 ENCOUNTER — Inpatient Hospital Stay: Payer: 59

## 2020-10-30 ENCOUNTER — Inpatient Hospital Stay (HOSPITAL_COMMUNITY)
Admission: EM | Admit: 2020-10-30 | Discharge: 2020-11-02 | Disposition: A | Discharge status: Home / Self Care | Payer: 59 | Source: Home / Self Care | Attending: Family Medicine | Admitting: Family Medicine

## 2020-10-30 ENCOUNTER — Encounter: Payer: Self-pay | Admitting: Family Medicine

## 2020-10-30 ENCOUNTER — Other Ambulatory Visit: Payer: Self-pay

## 2020-10-30 DIAGNOSIS — E871 Hypo-osmolality and hyponatremia: Principal | ICD-10-CM

## 2020-10-30 DIAGNOSIS — D6959 Other secondary thrombocytopenia: Secondary | ICD-10-CM | POA: Diagnosis present

## 2020-10-30 DIAGNOSIS — K7010 Alcoholic hepatitis without ascites: Secondary | ICD-10-CM | POA: Diagnosis present

## 2020-10-30 DIAGNOSIS — F419 Anxiety disorder, unspecified: Secondary | ICD-10-CM | POA: Diagnosis present

## 2020-10-30 DIAGNOSIS — K760 Fatty (change of) liver, not elsewhere classified: Secondary | ICD-10-CM | POA: Diagnosis present

## 2020-10-30 DIAGNOSIS — E876 Hypokalemia: Secondary | ICD-10-CM | POA: Diagnosis present

## 2020-10-30 DIAGNOSIS — Z91030 Bee allergy status: Secondary | ICD-10-CM

## 2020-10-30 DIAGNOSIS — Z20822 Contact with and (suspected) exposure to covid-19: Secondary | ICD-10-CM | POA: Diagnosis present

## 2020-10-30 DIAGNOSIS — R109 Unspecified abdominal pain: Secondary | ICD-10-CM

## 2020-10-30 DIAGNOSIS — F10231 Alcohol dependence with withdrawal delirium: Secondary | ICD-10-CM

## 2020-10-30 DIAGNOSIS — F10230 Alcohol dependence with withdrawal, uncomplicated: Secondary | ICD-10-CM | POA: Diagnosis present

## 2020-10-30 DIAGNOSIS — G9341 Metabolic encephalopathy: Secondary | ICD-10-CM | POA: Diagnosis present

## 2020-10-30 DIAGNOSIS — F17210 Nicotine dependence, cigarettes, uncomplicated: Secondary | ICD-10-CM | POA: Diagnosis present

## 2020-10-30 DIAGNOSIS — E860 Dehydration: Secondary | ICD-10-CM | POA: Diagnosis present

## 2020-10-30 DIAGNOSIS — F10931 Alcohol use, unspecified with withdrawal delirium: Secondary | ICD-10-CM

## 2020-10-30 LAB — CBC
HCT: 34.5 % — ABNORMAL LOW (ref 39.0–52.0)
HCT: 38.5 % — ABNORMAL LOW (ref 39.0–52.0)
HCT: 36.6 % — ABNORMAL LOW (ref 39.0–52.0)
Hemoglobin: 12.5 g/dL — ABNORMAL LOW (ref 13.0–17.0)
Hemoglobin: 13.9 g/dL (ref 13.0–17.0)
Hemoglobin: 12.8 g/dL — ABNORMAL LOW (ref 13.0–17.0)
MCH: 32.5 pg (ref 26.0–34.0)
MCH: 32.3 pg (ref 26.0–34.0)
MCH: 32.1 pg (ref 26.0–34.0)
MCHC: 36.2 g/dL — ABNORMAL HIGH (ref 30.0–36.0)
MCHC: 36.1 g/dL — ABNORMAL HIGH (ref 30.0–36.0)
MCHC: 35 g/dL (ref 30.0–36.0)
MCV: 89.6 fL (ref 80.0–100.0)
MCV: 89.5 fL (ref 80.0–100.0)
MCV: 91.7 fL (ref 80.0–100.0)
Platelets: 77 10*3/uL — ABNORMAL LOW (ref 150–400)
Platelets: 73 10*3/uL — ABNORMAL LOW (ref 150–400)
Platelets: 67 10*3/uL — ABNORMAL LOW (ref 150–400)
RBC: 3.85 MIL/uL — ABNORMAL LOW (ref 4.22–5.81)
RBC: 4.3 MIL/uL (ref 4.22–5.81)
RBC: 3.99 MIL/uL — ABNORMAL LOW (ref 4.22–5.81)
RDW: 13.2 % (ref 11.5–15.5)
RDW: 13.2 % (ref 11.5–15.5)
RDW: 13.5 % (ref 11.5–15.5)
WBC: 5 10*3/uL (ref 4.0–10.5)
WBC: 5.8 10*3/uL (ref 4.0–10.5)
WBC: 6 10*3/uL (ref 4.0–10.5)
nRBC: 0 % (ref 0.0–0.2)
nRBC: 0 % (ref 0.0–0.2)
nRBC: 0 % (ref 0.0–0.2)

## 2020-10-30 LAB — COMPREHENSIVE METABOLIC PANEL
ALT: 122 U/L — ABNORMAL HIGH (ref 0–44)
ALT: 145 U/L — ABNORMAL HIGH (ref 0–44)
ALT: 126 U/L — ABNORMAL HIGH (ref 0–44)
AST: 408 U/L — ABNORMAL HIGH (ref 15–41)
AST: 408 U/L — ABNORMAL HIGH (ref 15–41)
AST: 328 U/L — ABNORMAL HIGH (ref 15–41)
Albumin: 2.5 g/dL — ABNORMAL LOW (ref 3.5–5.0)
Albumin: 3.1 g/dL — ABNORMAL LOW (ref 3.5–5.0)
Albumin: 2.9 g/dL — ABNORMAL LOW (ref 3.5–5.0)
Alkaline Phosphatase: 72 U/L (ref 38–126)
Alkaline Phosphatase: 79 U/L (ref 38–126)
Alkaline Phosphatase: 70 U/L (ref 38–126)
Anion gap: 8 (ref 5–15)
Anion gap: 9 (ref 5–15)
Anion gap: 4 — ABNORMAL LOW (ref 5–15)
BUN: <5 mg/dL — ABNORMAL LOW (ref 6–20)
BUN: <5 mg/dL — ABNORMAL LOW (ref 6–20)
BUN: <5 mg/dL — ABNORMAL LOW (ref 6–20)
CO2: 22 mmol/L (ref 22–32)
CO2: 19 mmol/L — ABNORMAL LOW (ref 22–32)
CO2: 27 mmol/L (ref 22–32)
Calcium: 7.5 mg/dL — ABNORMAL LOW (ref 8.9–10.3)
Calcium: 8.3 mg/dL — ABNORMAL LOW (ref 8.9–10.3)
Calcium: 8.1 mg/dL — ABNORMAL LOW (ref 8.9–10.3)
Chloride: 98 mmol/L (ref 98–111)
Chloride: 98 mmol/L (ref 98–111)
Chloride: 101 mmol/L (ref 98–111)
Creatinine, Ser: 0.69 mg/dL (ref 0.61–1.24)
Creatinine, Ser: 0.7 mg/dL (ref 0.61–1.24)
Creatinine, Ser: 0.75 mg/dL (ref 0.61–1.24)
GFR, Estimated: >60 mL/min (ref >60)
GFR, Estimated: >60 mL/min (ref >60)
GFR, Estimated: >60 mL/min (ref >60)
Glucose, Bld: 98 mg/dL (ref 70–99)
Glucose, Bld: 124 mg/dL — ABNORMAL HIGH (ref 70–99)
Glucose, Bld: 105 mg/dL — ABNORMAL HIGH (ref 70–99)
Potassium: 3.1 mmol/L — ABNORMAL LOW (ref 3.5–5.1)
Potassium: 4.1 mmol/L (ref 3.5–5.1)
Potassium: 3.9 mmol/L (ref 3.5–5.1)
Sodium: 128 mmol/L — ABNORMAL LOW (ref 135–145)
Sodium: 126 mmol/L — ABNORMAL LOW (ref 135–145)
Sodium: 132 mmol/L — ABNORMAL LOW (ref 135–145)
Total Bilirubin: 2.2 mg/dL — ABNORMAL HIGH (ref 0.3–1.2)
Total Bilirubin: 2.6 mg/dL — ABNORMAL HIGH (ref 0.3–1.2)
Total Bilirubin: 1.9 mg/dL — ABNORMAL HIGH (ref 0.3–1.2)
Total Protein: 5.4 g/dL — ABNORMAL LOW (ref 6.5–8.1)
Total Protein: 6.5 g/dL (ref 6.5–8.1)
Total Protein: 5.8 g/dL — ABNORMAL LOW (ref 6.5–8.1)

## 2020-10-30 LAB — CREATININE, URINE, RANDOM: Creatinine, Urine: 94 mg/dL

## 2020-10-30 LAB — PHOSPHORUS: Phosphorus: 1.7 mg/dL — ABNORMAL LOW (ref 2.5–4.6)

## 2020-10-30 LAB — URINE DRUG SCREEN, QUALITATIVE (ARMC ONLY)
Amphetamines, Ur Screen: NOT DETECTED
Barbiturates, Ur Screen: NOT DETECTED
Benzodiazepine, Ur Scrn: POSITIVE — AB
Cannabinoid 50 Ng, Ur ~~LOC~~: NOT DETECTED
Cocaine Metabolite,Ur ~~LOC~~: NOT DETECTED
MDMA (Ecstasy)Ur Screen: NOT DETECTED
Methadone Scn, Ur: NOT DETECTED
Opiate, Ur Screen: NOT DETECTED
Phencyclidine (PCP) Ur S: NOT DETECTED
Tricyclic, Ur Screen: NOT DETECTED

## 2020-10-30 LAB — URINALYSIS, COMPLETE (UACMP) WITH MICROSCOPIC
Bacteria, UA: NONE SEEN
Bilirubin Urine: NEGATIVE
Glucose, UA: NEGATIVE mg/dL
Hgb urine dipstick: NEGATIVE
Ketones, ur: NEGATIVE mg/dL
Leukocytes,Ua: NEGATIVE
Nitrite: NEGATIVE
Protein, ur: NEGATIVE mg/dL
Specific Gravity, Urine: 1.014 (ref 1.005–1.030)
Squamous Epithelial / HPF: NONE SEEN (ref 0–5)
WBC, UA: NONE SEEN WBC/hpf (ref 0–5)
pH: 7 (ref 5.0–8.0)

## 2020-10-30 LAB — HEPATITIS PANEL, ACUTE
HCV Ab: NONREACTIVE
Hep A IgM: NONREACTIVE
Hep B C IgM: NONREACTIVE
Hepatitis B Surface Ag: NONREACTIVE

## 2020-10-30 LAB — HIV ANTIBODY (ROUTINE TESTING W REFLEX): HIV Screen 4th Generation wRfx: NONREACTIVE

## 2020-10-30 LAB — OSMOLALITY: Osmolality: 309 mOsm/kg — ABNORMAL HIGH (ref 275–295)

## 2020-10-30 LAB — SODIUM, URINE, RANDOM: Sodium, Ur: 27 mmol/L

## 2020-10-30 LAB — MAGNESIUM
Magnesium: 2.3 mg/dL (ref 1.7–2.4)
Magnesium: 1.6 mg/dL — ABNORMAL LOW (ref 1.7–2.4)

## 2020-10-30 LAB — TROPONIN I (HIGH SENSITIVITY): Troponin I (High Sensitivity): 30 ng/L — ABNORMAL HIGH (ref <18)

## 2020-10-30 LAB — LIPASE, BLOOD
Lipase: 51 U/L (ref 11–51)
Lipase: 38 U/L (ref 11–51)

## 2020-10-30 LAB — ETHANOL: Alcohol, Ethyl (B): <10 mg/dL (ref <10)

## 2020-10-30 LAB — OSMOLALITY, URINE: Osmolality, Ur: 285 mOsm/kg — ABNORMAL LOW (ref 300–900)

## 2020-10-30 MED ORDER — IOHEXOL 300 MG/ML  SOLN
100.0000 mL | Freq: Once | INTRAMUSCULAR | Status: AC | PRN
  Administered 2020-10-30: 100 mL via INTRAVENOUS

## 2020-10-30 MED ORDER — LORAZEPAM 2 MG PO TABS
0.0000 mg | ORAL_TABLET | Freq: Four times a day (QID) | ORAL | Status: DC
  Filled 2020-10-30: qty 2

## 2020-10-30 MED ORDER — SODIUM CHLORIDE 0.9 % IV BOLUS
1000.0000 mL | Freq: Once | INTRAVENOUS | Status: AC
  Administered 2020-10-30: 1000 mL via INTRAVENOUS

## 2020-10-30 MED ORDER — LORAZEPAM 1 MG PO TABS
1.0000 mg | ORAL_TABLET | ORAL | Status: DC | PRN
Start: 2020-10-30 — End: 2020-10-31

## 2020-10-30 MED ORDER — ALBUTEROL SULFATE (2.5 MG/3ML) 0.083% IN NEBU: 2.5000 mg | INHALATION_SOLUTION | Freq: Four times a day (QID) | RESPIRATORY_TRACT | Status: DC | PRN

## 2020-10-30 MED ORDER — THIAMINE HCL 100 MG PO TABS
100.0000 mg | ORAL_TABLET | Freq: Every day | ORAL | Status: DC
  Administered 2020-10-31: 100 mg via ORAL
  Filled 2020-10-30: qty 1

## 2020-10-30 MED ORDER — THIAMINE HCL 100 MG/ML IJ SOLN: 100.0000 mg | Freq: Every day | INTRAMUSCULAR | Status: DC

## 2020-10-30 MED ORDER — ONDANSETRON HCL 4 MG PO TABS: 4.0000 mg | ORAL_TABLET | Freq: Four times a day (QID) | ORAL | Status: DC | PRN

## 2020-10-30 MED ORDER — FOLIC ACID 1 MG PO TABS
1.0000 mg | ORAL_TABLET | Freq: Every day | ORAL | Status: DC
  Administered 2020-10-31 – 2020-11-02 (×3): 1 mg via ORAL
  Filled 2020-10-30 (×3): qty 1

## 2020-10-30 MED ORDER — MAGNESIUM CITRATE PO SOLN
1.0000 | Freq: Once | ORAL | Status: DC | PRN
  Filled 2020-10-30: qty 296

## 2020-10-30 MED ORDER — IOHEXOL 9 MG/ML PO SOLN
500.0000 mL | ORAL | Status: AC
  Administered 2020-10-30 (×2): 500 mL via ORAL

## 2020-10-30 MED ORDER — IPRATROPIUM BROMIDE 0.02 % IN SOLN: 0.5000 mg | Freq: Four times a day (QID) | RESPIRATORY_TRACT | Status: DC | PRN

## 2020-10-30 MED ORDER — SODIUM CHLORIDE 0.9 % IV BOLUS
500.0000 mL | Freq: Once | INTRAVENOUS | Status: AC
  Administered 2020-10-30: 500 mL via INTRAVENOUS

## 2020-10-30 MED ORDER — THIAMINE HCL 100 MG/ML IJ SOLN
100.0000 mg | Freq: Every day | INTRAMUSCULAR | Status: DC
  Administered 2020-10-30: 100 mg via INTRAVENOUS
  Filled 2020-10-30: qty 2

## 2020-10-30 MED ORDER — BISACODYL 5 MG PO TBEC: 5.0000 mg | DELAYED_RELEASE_TABLET | Freq: Every day | ORAL | Status: DC | PRN

## 2020-10-30 MED ORDER — LORAZEPAM 2 MG PO TABS
0.0000 mg | ORAL_TABLET | Freq: Two times a day (BID) | ORAL | Status: DC
Start: 2020-11-01 — End: 2020-10-31

## 2020-10-30 MED ORDER — ZOLPIDEM TARTRATE 5 MG PO TABS
5.0000 mg | ORAL_TABLET | Freq: Every evening | ORAL | Status: DC | PRN
Start: 2020-10-30 — End: 2020-11-02

## 2020-10-30 MED ORDER — POTASSIUM CHLORIDE CRYS ER 20 MEQ PO TBCR
40.0000 meq | EXTENDED_RELEASE_TABLET | ORAL | Status: AC
  Administered 2020-10-30 (×2): 40 meq via ORAL
  Filled 2020-10-30 (×2): qty 2

## 2020-10-30 MED ORDER — LORAZEPAM 2 MG PO TABS
0.0000 mg | ORAL_TABLET | Freq: Four times a day (QID) | ORAL | Status: DC
  Administered 2020-10-30: 2 mg via ORAL

## 2020-10-30 MED ORDER — ADULT MULTIVITAMIN W/MINERALS CH
1.0000 | ORAL_TABLET | Freq: Every day | ORAL | Status: DC
  Administered 2020-10-31 – 2020-11-02 (×3): 1 via ORAL
  Filled 2020-10-30 (×3): qty 1

## 2020-10-30 MED ORDER — LORAZEPAM 2 MG/ML IJ SOLN
0.0000 mg | Freq: Four times a day (QID) | INTRAMUSCULAR | Status: DC
  Administered 2020-10-30: 2 mg via INTRAVENOUS
  Filled 2020-10-30: qty 1

## 2020-10-30 MED ORDER — ONDANSETRON HCL 4 MG/2ML IJ SOLN: 4.0000 mg | Freq: Four times a day (QID) | INTRAMUSCULAR | Status: DC | PRN

## 2020-10-30 MED ORDER — SENNOSIDES-DOCUSATE SODIUM 8.6-50 MG PO TABS: 1.0000 | ORAL_TABLET | Freq: Every evening | ORAL | Status: DC | PRN

## 2020-10-30 MED ORDER — ACETAMINOPHEN 325 MG PO TABS: 650.0000 mg | ORAL_TABLET | Freq: Four times a day (QID) | ORAL | Status: DC | PRN

## 2020-10-30 MED ORDER — MORPHINE SULFATE (PF) 2 MG/ML IV SOLN: 1.0000 mg | Freq: Four times a day (QID) | INTRAVENOUS | Status: DC | PRN

## 2020-10-30 MED ORDER — ACETAMINOPHEN 650 MG RE SUPP: 650.0000 mg | Freq: Four times a day (QID) | RECTAL | Status: DC | PRN

## 2020-10-30 MED ORDER — THIAMINE HCL 100 MG PO TABS
100.0000 mg | ORAL_TABLET | Freq: Every day | ORAL | Status: DC
  Administered 2020-11-01 – 2020-11-02 (×2): 100 mg via ORAL
  Filled 2020-10-30 (×2): qty 1

## 2020-10-30 MED ORDER — LORAZEPAM 2 MG/ML IJ SOLN
0.0000 mg | Freq: Two times a day (BID) | INTRAMUSCULAR | Status: DC
Start: 2020-11-02 — End: 2020-10-31

## 2020-10-30 MED ORDER — LORAZEPAM 2 MG PO TABS: 0.0000 mg | ORAL_TABLET | Freq: Two times a day (BID) | ORAL | Status: DC

## 2020-10-30 MED ORDER — LORAZEPAM 2 MG/ML IJ SOLN
1.0000 mg | INTRAMUSCULAR | Status: DC | PRN
  Administered 2020-10-31: 2 mg via INTRAVENOUS
  Filled 2020-10-30: qty 1

## 2020-10-30 NOTE — ED Notes (Signed)
First Nurse Note: Pt to ED via [facility] from [address]. Pt left hospital AMA, walked to [address] and called EMS, pt was here for detox.

## 2020-10-30 NOTE — H&P (Addendum)
History and Physical   [facility] - [place] @ [facility] Admission History and Physical [employer], D.O.    Patient Name: Eric Booker MR#: [id number] Date of Birth: 1960-07-18 Date of Admission: 10/30/2020  Referring MD/NP/PA: Dr. Scotty Court Primary Care Physician: Pcp, No  Chief Complaint:  Chief Complaint  Patient presents with  . detox    HPI: Eric Booker is a 60 y.o. male with known history was admitted to the hospital by me last night for hyponatremia / beer potomania and EtOH withdrawal syndrome.  He signed out AMA today and was found confused and wandering by police.   Denies drinking since leaving the hospital.   Last drink was three days ago, usually drinks 2 40 ounce beers a day and smokes 1ppd.  Denies drug use.  States that he drinks water and beer and consumes minimal food.    Patient denies fevers/chills, weakness, dizziness, chest pain, shortness of breath, dysuria/frequency, changes in mental status.    EMS/ED Course: Medical admission has been requested for further continued  management of euvolemic hyponatremia, alcohol withdrawal syndrome.  Review of Systems:  CONSTITUTIONAL: No fever/chills, fatigue, weakness, weight gain/loss, headache. EYES: No blurry or double vision. ENT: No tinnitus, postnasal drip, redness or soreness of the oropharynx. RESPIRATORY: No cough, dyspnea, wheeze.  No hemoptysis.  CARDIOVASCULAR: No chest pain, palpitations, syncope, orthopnea. No lower extremity edema.  GASTROINTESTINAL: Positive nausea, vomiting, abdominal pain, negative  diarrhea, constipation.  No hematemesis, melena or hematochezia. GENITOURINARY: No dysuria, frequency, hematuria. ENDOCRINE: No polyuria or nocturia. No heat or cold intolerance. HEMATOLOGY: No anemia, bruising, bleeding. INTEGUMENTARY: No rashes, ulcers, lesions. MUSCULOSKELETAL: No arthritis, gout, dyspnea. NEUROLOGIC: No numbness, tingling, ataxia, seizure-type activity,  weakness. PSYCHIATRIC: No anxiety, depression, insomnia.   Past medical history: Negative Past surgical history: Negative   reports that he has been smoking. He uses smokeless tobacco. He reports current alcohol use of about 12.0 standard drinks of alcohol per week. No history on file for drug use.  Allergies  Allergen Reactions  . Bee Venom Swelling    No family history on file.  Prior to Admission medications   Medication Sig Start Date End Date Taking? Authorizing Provider  metoCLOPramide (REGLAN) 10 MG tablet Take 1 tablet (10 mg total) by mouth every 8 (eight) hours as needed for nausea. 05/01/19 04/30/20  Willy Eddy, MD    Physical Exam: Vitals:   10/30/20 1747 10/30/20 1749  BP: 100/80   Pulse: (!) 120   Resp: 18   Temp: 99 F (37.2 C)   TempSrc: Oral   SpO2: 97%   Weight:  86.2 kg  Height:  6' (1.829 m)    GENERAL: 60 y.o.-year-old white male patient, well-developed, well-nourished lying in the bed in no acute distress.  Pleasant and cooperative.   HEENT: Head atraumatic, normocephalic. Pupils equal. Mucus membranes moist. NECK: Supple. No JVD. CHEST: Normal breath sounds bilaterally. No wheezing, rales, rhonchi or crackles. No use of accessory muscles of respiration.  No reproducible chest wall tenderness.  CARDIOVASCULAR: S1, S2 normal. No murmurs, rubs, or gallops. Cap refill <2 seconds. Pulses intact distally.  ABDOMEN: Soft, mild midepigasticTTP. No rebound, guarding, rigidity. Normoactive bowel sounds present in all four quadrants.  EXTREMITIES: No pedal edema, cyanosis, or clubbing. No calf tenderness or Homan's sign.  NEUROLOGIC: Mildly tremulous.  The patient is alert and oriented x 3. Cranial nerves II through XII are grossly intact with no focal sensorimotor deficit. PSYCHIATRIC:  Normal affect, mood, thought content. SKIN: Warm, dry,  and intact without obvious rash, lesion, or ulcer.    Labs on Admission:  CBC: Recent Labs  Lab  10/29/20 1511 10/29/20 2144 10/30/20 0500 10/30/20 1754  WBC 9.6 5.7 5.0 5.8  HGB 14.8 13.0 12.5* 13.9  HCT 40.2 36.2* 34.5* 38.5*  MCV 88.2 89.8 89.6 89.5  PLT 100* 86* 77* 73*   Basic Metabolic Panel: Recent Labs  Lab 10/29/20 1511 10/29/20 2144 10/30/20 0500 10/30/20 0939 10/30/20 1754  NA 124* 127* 128*  --  126*  K 3.7 3.5 3.1*  --  4.1  CL 91* 97* 98  --  98  CO2 17* 21* 22  --  19*  GLUCOSE 159* 136* 98  --  124*  BUN 11 8 <5*  --  <5*  CREATININE 1.02 0.87 0.69  --  0.70  CALCIUM 8.1* 7.4* 7.5*  --  8.3*  MG  --  1.4*  --  2.3  --   PHOS  --  2.7  --   --   --    GFR: Estimated Creatinine Clearance: 109.1 mL/min (by C-G formula based on SCr of 0.7 mg/dL). Liver Function Tests: Recent Labs  Lab 10/29/20 1511 10/29/20 2144 10/30/20 0500 10/30/20 1754  AST 557* 466* 408* 408*  ALT 149* 129* 122* 145*  ALKPHOS 87 74 72 79  BILITOT 2.5* 2.1* 2.2* 2.6*  PROT 6.6 5.6* 5.4* 6.5  ALBUMIN 3.3* 2.7* 2.5* 3.1*   Recent Labs  Lab 10/29/20 1511 10/30/20 0939 10/30/20 1754  LIPASE 54* 51 38   No results for input(s): AMMONIA in the last 168 hours. Coagulation Profile: No results for input(s): INR, PROTIME in the last 168 hours. Cardiac Enzymes: No results for input(s): CKTOTAL, CKMB, CKMBINDEX, TROPONINI in the last 168 hours. BNP (last 3 results) No results for input(s): PROBNP in the last 8760 hours. HbA1C: No results for input(s): HGBA1C in the last 72 hours. CBG: No results for input(s): GLUCAP in the last 168 hours. Lipid Profile: No results for input(s): CHOL, HDL, LDLCALC, TRIG, CHOLHDL, LDLDIRECT in the last 72 hours. Thyroid Function Tests: No results for input(s): TSH, T4TOTAL, FREET4, T3FREE, THYROIDAB in the last 72 hours. Anemia Panel: No results for input(s): VITAMINB12, FOLATE, FERRITIN, TIBC, IRON, RETICCTPCT in the last 72 hours. Urine analysis:    Component Value Date/Time   COLORURINE YELLOW (A) 10/29/2020 1926   APPEARANCEUR CLEAR  (A) 10/29/2020 1926   APPEARANCEUR Clear 01/20/2013 1349   LABSPEC 1.006 10/29/2020 1926   LABSPEC 1.009 01/20/2013 1349   PHURINE 6.0 10/29/2020 1926   GLUCOSEU NEGATIVE 10/29/2020 1926   GLUCOSEU Negative 01/20/2013 1349   HGBUR SMALL (A) 10/29/2020 1926   BILIRUBINUR NEGATIVE 10/29/2020 1926   BILIRUBINUR Negative 01/20/2013 1349   KETONESUR 5 (A) 10/29/2020 1926   PROTEINUR NEGATIVE 10/29/2020 1926   NITRITE NEGATIVE 10/29/2020 1926   LEUKOCYTESUR NEGATIVE 10/29/2020 1926   LEUKOCYTESUR Negative 01/20/2013 1349   Sepsis Labs: @LABRCNTIP (procalcitonin:4,lacticidven:4) ) Recent Results (from the past 240 hour(s))  Resp Panel by RT-PCR (Flu A&B, Covid) Nasopharyngeal Swab     Status: None   Collection Time: 10/29/20  7:26 PM   Specimen: Nasopharyngeal Swab; Nasopharyngeal(NP) swabs in vial transport medium  Result Value Ref Range Status   SARS Coronavirus 2 by RT PCR NEGATIVE NEGATIVE Final    Comment: (NOTE) SARS-CoV-2 target nucleic acids are NOT DETECTED.  The SARS-CoV-2 RNA is generally detectable in upper respiratory specimens during the acute phase of infection. The lowest concentration of SARS-CoV-2 viral copies  this assay can detect is 138 copies/mL. A negative result does not preclude SARS-Cov-2 infection and should not be used as the sole basis for treatment or other patient management decisions. A negative result may occur with  improper specimen collection/handling, submission of specimen other than nasopharyngeal swab, presence of viral mutation(s) within the areas targeted by this assay, and inadequate number of viral copies(<138 copies/mL). A negative result must be combined with clinical observations, patient history, and epidemiological information. The expected result is Negative.  Fact Sheet for Patients:  BloggerCourse.com  Fact Sheet for Healthcare Providers:  SeriousBroker.it  This test is no t yet  approved or cleared by the [place] FDA and  has been authorized for detection and/or diagnosis of SARS-CoV-2 by FDA under an Emergency Use Authorization (EUA). This EUA will remain  in effect (meaning this test can be used) for the duration of the COVID-19 declaration under Section 564(b)(1) of the Act, 21 U.S.C.section 360bbb-3(b)(1), unless the authorization is terminated  or revoked sooner.       Influenza A by PCR NEGATIVE NEGATIVE Final   Influenza B by PCR NEGATIVE NEGATIVE Final    Comment: (NOTE) The Xpert Xpress SARS-CoV-2/FLU/RSV plus assay is intended as an aid in the diagnosis of influenza from Nasopharyngeal swab specimens and should not be used as a sole basis for treatment. Nasal washings and aspirates are unacceptable for Xpert Xpress SARS-CoV-2/FLU/RSV testing.  Fact Sheet for Patients: BloggerCourse.com  Fact Sheet for Healthcare Providers: SeriousBroker.it  This test is not yet approved or cleared by the [place] FDA and has been authorized for detection and/or diagnosis of SARS-CoV-2 by FDA under an Emergency Use Authorization (EUA). This EUA will remain in effect (meaning this test can be used) for the duration of the COVID-19 declaration under Section 564(b)(1) of the Act, 21 U.S.C. section 360bbb-3(b)(1), unless the authorization is terminated or revoked.  Performed at [facility], [address], [place], [place] [address]      Radiological Exams on Admission: CT ABDOMEN PELVIS W CONTRAST  Result Date: 10/30/2020 CLINICAL DATA:  Nausea, vomiting. EXAM: CT ABDOMEN AND PELVIS WITH CONTRAST TECHNIQUE: Multidetector CT imaging of the abdomen and pelvis was performed using the standard protocol following bolus administration of intravenous contrast. CONTRAST:  OMNIPAQUE IOHEXOL 300 MG/ML  SOLN COMPARISON:  May 01, 2019. FINDINGS: Lower chest: No acute abnormality.  Hepatobiliary: No gallstones or biliary dilatation is noted. Hepatic steatosis is noted. Pancreas: Unremarkable. No pancreatic ductal dilatation or surrounding inflammatory changes. Spleen: Normal in size without focal abnormality. Adrenals/Urinary Tract: Adrenal glands are unremarkable. Kidneys are normal, without renal calculi, focal lesion, or hydronephrosis. Bladder is unremarkable. Stomach/Bowel: Stomach is within normal limits. Appendix appears normal. No evidence of bowel wall thickening, distention, or inflammatory changes. Vascular/Lymphatic: Aortic atherosclerosis. No enlarged abdominal or pelvic lymph nodes. Reproductive: Prostate is unremarkable. Other: Small fat containing bilateral inguinal hernias are noted. No ascites is noted. Musculoskeletal: No acute or significant osseous findings. IMPRESSION: Hepatic steatosis. No acute abnormality seen in the abdomen or pelvis. Aortic Atherosclerosis (ICD10-I70.0). Electronically Signed   By: Lupita Raider M.D.   On: 10/30/2020 15:51   US Abdomen Limited RUQ (LIVER/GB)  Result Date: 10/30/2020 CLINICAL DATA:  Acute generalized abdominal pain. EXAM: ULTRASOUND ABDOMEN LIMITED RIGHT UPPER QUADRANT COMPARISON:  None. FINDINGS: Gallbladder: No gallstones or wall thickening visualized. No sonographic Murphy sign noted by sonographer. Common bile duct: Diameter: 4 mm which is within normal limits. Liver: No focal lesion identified. Increased echogenicity  of hepatic parenchyma is noted suggesting hepatic steatosis. Portal vein is patent on color Doppler imaging with normal direction of blood flow towards the liver. Other: None. IMPRESSION: Hepatic steatosis. No other abnormality seen in the right upper quadrant of the abdomen. Electronically Signed   By: Lupita Raider M.D.   On: 10/30/2020 08:43    EKG: Sinus tachycardia at 123 bpm with normal axis, peaked T waves and nonspecific ST-T wave changes.   Assessment/Plan  This is a 60 y.o. male with no  significant past medical history now being admitted with:  #.  Nausea and vomiting with elevated troponin, questionable atypical presentation of ACS -Admit to inpatient, telemetry -Trend troponins, check TSH and lipids -Antiemetics as needed  #.  Euvolemic hyponatremia likely secondary to above - Fluid restrict to 1.5 L  #.  Alcohol use disorder with withdrawal - CIWA protocol - Oral Ativan taper over the next 3 days - Multivitamin, folic acid and thiamine -Fall and seizure precautions  Admission status: Inpatient IV Fluids: Hep-Lock Diet/Nutrition: Clear liquid advance as tolerated Consults called: None DVT Px: SCDs and early ambulation. Code Status: Full Code  Disposition Plan: To home in 1-2 days  All the records are reviewed and case discussed with ED provider. Management plans discussed with the patient and/or family who express understanding and agree with plan of care.  Aidaly Cordner D.O. on 10/30/2020 at 7:29 PM CC: Primary care physician; Pcp, No   10/30/2020, 7:29 PM

## 2020-10-30 NOTE — Discharge Summary (Signed)
Physician Discharge Summary  Eric Booker DDU:[id number] DOB: [birth year] DOA: 10/29/2020  PCP: Pcp, No    Patient left AMA  Admit date: 10/29/2020 Discharge date: 10/30/2020  Time spent: 30 minutes  Recommendations for Outpatient Follow-up:  1. Patient left AMA   Discharge Diagnoses:  Active Problems:   Hyponatremia   Discharge Condition: Left AMA  Diet recommendation: Left AMA  Filed Weights   10/29/20 1502 10/29/20 2130  Weight: 81.2 kg 79.1 kg    History of present illness:  HPI per Dr. Christella Scheuermann [name] is a 60 y.o. male with known history presents to the emergency department for evaluation of nausea and vomiting.  Patient was in a usual state of health until his ago when he developed difficulty eating, low appetite secondary to nausea and vomiting which is nonbloody nonbilious..  Last drink was two days ago, usually drinks 2 40 ounce beers a day and smokes 1ppd.  No drug use.   Patient denies fevers/chills, weakness, dizziness, chest pain, shortness of breath, dysuria/frequency, changes in mental status.    Otherwise there has been no change in status. Patient has been taking medication as prescribed and there has been no recent change in medication or diet.  No recent antibiotics.  There has been no recent illness, hospitalizations, travel or sick contacts.    EMS/ED Course: Medical admission has been requested for further management of euvolemic hyponatremia, alcohol withdrawal syndrome.   Hospital Course:  1 nausea vomiting elevated troponin/?  Atypical presentation of ACS -Patient was admitted with nausea vomiting elevated troponin concern for atypical presentation of ACS. -TSH done was within normal limits at 1.755. -Cardiac enzymes were mildly elevated but flattened.  Patient denied any acute chest pain. -Right upper quadrant ultrasound done consistent with hepatic steatosis with no acute abnormalities -CT abdomen and pelvis done consistent with  hepatic steatosis with no acute abnormalities. -Patient had no further nausea or emesis. -Patient subsequently left AMA while work-up was in process.  2.  Hyponatremia -Felt secondary to problem #1. -Patient left AMA.  3.  Alcohol use disorder with withdrawal -Patient was placed on the Ativan withdrawal protocol, thiamine, folic acid, multivitamin. -Patient subsequently left AMA  Procedures:  CT abdomen and pelvis 10/30/2020  Right upper quadrant ultrasound 10/30/2020  Consultations:  None  Discharge Exam: Vitals:   10/30/20 0821 10/30/20 1133  BP: 117/77 123/87  Pulse: 74 75  Resp: 20 18  Temp: 97.9 F (36.6 C) 98.1 F (36.7 C)  SpO2: 99% 100%    General: NAD Cardiovascular: RRR Respiratory: CTA B  Discharge Instructions  Patient left AMA   Allergies  Allergen Reactions  . Bee Venom Swelling      The results of significant diagnostics from this hospitalization (including imaging, microbiology, ancillary and laboratory) are listed below for reference.    Significant Diagnostic Studies: CT ABDOMEN PELVIS W CONTRAST  Result Date: 10/30/2020 CLINICAL DATA:  Nausea, vomiting. EXAM: CT ABDOMEN AND PELVIS WITH CONTRAST TECHNIQUE: Multidetector CT imaging of the abdomen and pelvis was performed using the standard protocol following bolus administration of intravenous contrast. CONTRAST:  OMNIPAQUE IOHEXOL 300 MG/ML  SOLN COMPARISON:  May 01, 2019. FINDINGS: Lower chest: No acute abnormality. Hepatobiliary: No gallstones or biliary dilatation is noted. Hepatic steatosis is noted. Pancreas: Unremarkable. No pancreatic ductal dilatation or surrounding inflammatory changes. Spleen: Normal in size without focal abnormality. Adrenals/Urinary Tract: Adrenal glands are unremarkable. Kidneys are normal, without renal calculi, focal lesion, or hydronephrosis. Bladder is unremarkable. Stomach/Bowel: Stomach is within normal  limits. Appendix appears normal. No evidence of  bowel wall thickening, distention, or inflammatory changes. Vascular/Lymphatic: Aortic atherosclerosis. No enlarged abdominal or pelvic lymph nodes. Reproductive: Prostate is unremarkable. Other: Small fat containing bilateral inguinal hernias are noted. No ascites is noted. Musculoskeletal: No acute or significant osseous findings. IMPRESSION: Hepatic steatosis. No acute abnormality seen in the abdomen or pelvis. Aortic Atherosclerosis (ICD10-I70.0). Electronically Signed   By: Lupita Raider M.D.   On: 10/30/2020 15:51   US Abdomen Limited RUQ (LIVER/GB)  Result Date: 10/30/2020 CLINICAL DATA:  Acute generalized abdominal pain. EXAM: ULTRASOUND ABDOMEN LIMITED RIGHT UPPER QUADRANT COMPARISON:  None. FINDINGS: Gallbladder: No gallstones or wall thickening visualized. No sonographic Murphy sign noted by sonographer. Common bile duct: Diameter: 4 mm which is within normal limits. Liver: No focal lesion identified. Increased echogenicity of hepatic parenchyma is noted suggesting hepatic steatosis. Portal vein is patent on color Doppler imaging with normal direction of blood flow towards the liver. Other: None. IMPRESSION: Hepatic steatosis. No other abnormality seen in the right upper quadrant of the abdomen. Electronically Signed   By: Lupita Raider M.D.   On: 10/30/2020 08:43    Microbiology: Recent Results (from the past 240 hour(s))  Resp Panel by RT-PCR (Flu A&B, Covid) Nasopharyngeal Swab     Status: None   Collection Time: 10/29/20  7:26 PM   Specimen: Nasopharyngeal Swab; Nasopharyngeal(NP) swabs in vial transport medium  Result Value Ref Range Status   SARS Coronavirus 2 by RT PCR NEGATIVE NEGATIVE Final    Comment: (NOTE) SARS-CoV-2 target nucleic acids are NOT DETECTED.  The SARS-CoV-2 RNA is generally detectable in upper respiratory specimens during the acute phase of infection. The lowest concentration of SARS-CoV-2 viral copies this assay can detect is 138 copies/mL. A negative  result does not preclude SARS-Cov-2 infection and should not be used as the sole basis for treatment or other patient management decisions. A negative result may occur with  improper specimen collection/handling, submission of specimen other than nasopharyngeal swab, presence of viral mutation(s) within the areas targeted by this assay, and inadequate number of viral copies(<138 copies/mL). A negative result must be combined with clinical observations, patient history, and epidemiological information. The expected result is Negative.  Fact Sheet for Patients:  BloggerCourse.com  Fact Sheet for Healthcare Providers:  SeriousBroker.it  This test is no t yet approved or cleared by the [place] FDA and  has been authorized for detection and/or diagnosis of SARS-CoV-2 by FDA under an Emergency Use Authorization (EUA). This EUA will remain  in effect (meaning this test can be used) for the duration of the COVID-19 declaration under Section 564(b)(1) of the Act, 21 U.S.C.section 360bbb-3(b)(1), unless the authorization is terminated  or revoked sooner.       Influenza A by PCR NEGATIVE NEGATIVE Final   Influenza B by PCR NEGATIVE NEGATIVE Final    Comment: (NOTE) The Xpert Xpress SARS-CoV-2/FLU/RSV plus assay is intended as an aid in the diagnosis of influenza from Nasopharyngeal swab specimens and should not be used as a sole basis for treatment. Nasal washings and aspirates are unacceptable for Xpert Xpress SARS-CoV-2/FLU/RSV testing.  Fact Sheet for Patients: BloggerCourse.com  Fact Sheet for Healthcare Providers: SeriousBroker.it  This test is not yet approved or cleared by the [place] FDA and has been authorized for detection and/or diagnosis of SARS-CoV-2 by FDA under an Emergency Use Authorization (EUA). This EUA will remain in effect (meaning this test can be used)  for the duration of  the COVID-19 declaration under Section 564(b)(1) of the Act, 21 U.S.C. section 360bbb-3(b)(1), unless the authorization is terminated or revoked.  Performed at [facility], [address] [place], [place] [address]      Labs: Basic Metabolic Panel: Recent Labs  Lab 10/29/20 1511 10/29/20 2144 10/30/20 0500 10/30/20 0939  NA 124* 127* 128*  --   K 3.7 3.5 3.1*  --   CL 91* 97* 98  --   CO2 17* 21* 22  --   GLUCOSE 159* 136* 98  --   BUN 11 8 <5*  --   CREATININE 1.02 0.87 0.69  --   CALCIUM 8.1* 7.4* 7.5*  --   MG  --  1.4*  --  2.3  PHOS  --  2.7  --   --    Liver Function Tests: Recent Labs  Lab 10/29/20 1511 10/29/20 2144 10/30/20 0500  AST 557* 466* 408*  ALT 149* 129* 122*  ALKPHOS 87 74 72  BILITOT 2.5* 2.1* 2.2*  PROT 6.6 5.6* 5.4*  ALBUMIN 3.3* 2.7* 2.5*   Recent Labs  Lab 10/29/20 1511 10/30/20 0939  LIPASE 54* 51   No results for input(s): AMMONIA in the last 168 hours. CBC: Recent Labs  Lab 10/29/20 1511 10/29/20 2144 10/30/20 0500  WBC 9.6 5.7 5.0  HGB 14.8 13.0 12.5*  HCT 40.2 36.2* 34.5*  MCV 88.2 89.8 89.6  PLT 100* 86* 77*   Cardiac Enzymes: No results for input(s): CKTOTAL, CKMB, CKMBINDEX, TROPONINI in the last 168 hours. BNP: BNP (last 3 results) No results for input(s): BNP in the last 8760 hours.  ProBNP (last 3 results) No results for input(s): PROBNP in the last 8760 hours.  CBG: No results for input(s): GLUCAP in the last 168 hours.     Signed:  Ramiro Harvest MD.  [facility] 10/30/2020, 4:05 PM

## 2020-10-30 NOTE — Plan of Care (Addendum)
Pt is involved in and agrees with the plan of care. V/S stable except tachycardic. No complaints of pain at this time. Ciwwa-4-5; sched ativan given. Has 1x loose stool.

## 2020-10-30 NOTE — ED Notes (Signed)
Patient assisted to restroom ATT, patient is unsteady but able to ambulate with assistance.

## 2020-10-30 NOTE — ED Triage Notes (Addendum)
Pt to ER via [facility] with complaints of nausea. States he left AMA and after taking the meds felt better but now feels worse. States his pcp believes it may be prostate related. Also reports decrease appetite and "not feeling like himself". Denies pain.   Pt reports drinking two forties a day, unable to recall last drink but has been drinking "off and on" since [facility].  Sweat present to pt's forehead, pt with visual hallucinations in triage reports seeing something "fluffy" under the counter.   Patient discussed with Md Paduchowski.

## 2020-10-30 NOTE — Progress Notes (Signed)
Patient found by staff on 1C and brought patient back to his room.  Patient states he no longer wishes to stay in the hospital.  Patient tried leaving two more times before signing AMA form.  Patient PIV removed prior to leaving. MD Janee Morn notified of patient left against medical advice.

## 2020-10-30 NOTE — Progress Notes (Incomplete)
PROGRESS NOTE    Eric Booker  VOH:[id number] DOB: 1960-12-15 DOA: 10/29/2020 PCP: Pcp, No (Confirm with patient/family/NH records and if not entered, this HAS to be entered at [facility] point of entry. "No PCP" if truly none.)   Chief Complaint  Patient presents with  . Emesis    Brief Narrative: (Start on day 1 of progress note - keep it brief and live) ***   Assessment & Plan:   Active Problems:   Hyponatremia   ***   DVT prophylaxis: (Lovenox/Heparin/SCD's/anticoagulated/None (if comfort care) Code Status: (Full/Partial - specify details) Family Communication: (Specify name, relationship & date discussed. NO "discussed with patient") Disposition:   Status is: Inpatient  {Inpatient:23812}  Dispo: The patient is from: {From:23814}              Anticipated d/c is to: {To:23815}              Patient currently {Medically stable:23817}   Difficult to place patient {Yes/No:25151}       Consultants:   ***  Procedures: (Don't include imaging studies which can be auto populated. Include things that cannot be auto populated i.e. Echo, Carotid and venous dopplers, Foley, Bipap, HD, tubes/drains, wound vac, central lines etc)  ***  Antimicrobials: (specify start and planned stop date. Auto populated tables are space occupying and do not give end dates)  ***    Subjective: ***  Objective: Vitals:   10/29/20 2130 10/29/20 2344 10/30/20 0345 10/30/20 0821  BP: (!) 144/85 131/89 137/82 117/77  Pulse: 97 80 96 74  Resp: 16 20 16 20   Temp: 98.4 F (36.9 C) 98.2 F (36.8 C) 99 F (37.2 C) 97.9 F (36.6 C)  TempSrc:  Oral Oral   SpO2: 100% 93% 98% 99%  Weight: 79.1 kg     Height: 6' 0.01" (1.829 m)       Intake/Output Summary (Last 24 hours) at 10/30/2020 1057 Last data filed at 10/30/2020 0939 Gross per 24 hour  Intake 1780 ml  Output 350 ml  Net 1430 ml   Filed Weights   10/29/20 1502 10/29/20 2130  Weight: 81.2 kg 79.1 kg    Examination:  General  exam: Appears calm and comfortable  Respiratory system: Clear to auscultation. Respiratory effort normal. Cardiovascular system: S1 & S2 heard, RRR. No JVD, murmurs, rubs, gallops or clicks. No pedal edema. Gastrointestinal system: Abdomen is nondistended, soft and nontender. No organomegaly or masses felt. Normal bowel sounds heard. Central nervous system: Alert and oriented. No focal neurological deficits. Extremities: Symmetric 5 x 5 power. Skin: No rashes, lesions or ulcers Psychiatry: Judgement and insight appear normal. Mood & affect appropriate.     Data Reviewed: I have personally reviewed following labs and imaging studies  CBC: Recent Labs  Lab 10/29/20 1511 10/29/20 2144 10/30/20 0500  WBC 9.6 5.7 5.0  HGB 14.8 13.0 12.5*  HCT 40.2 36.2* 34.5*  MCV 88.2 89.8 89.6  PLT 100* 86* 77*    Basic Metabolic Panel: Recent Labs  Lab 10/29/20 1511 10/29/20 2144 10/30/20 0500 10/30/20 0939  NA 124* 127* 128*  --   K 3.7 3.5 3.1*  --   CL 91* 97* 98  --   CO2 17* 21* 22  --   GLUCOSE 159* 136* 98  --   BUN 11 8 <5*  --   CREATININE 1.02 0.87 0.69  --   CALCIUM 8.1* 7.4* 7.5*  --   MG  --  1.4*  --  2.3  PHOS  --  2.7  --   --     GFR: Estimated Creatinine Clearance: 109.1 mL/min (by C-G formula based on SCr of 0.69 mg/dL).  Liver Function Tests: Recent Labs  Lab 10/29/20 1511 10/29/20 2144 10/30/20 0500  AST 557* 466* 408*  ALT 149* 129* 122*  ALKPHOS 87 74 72  BILITOT 2.5* 2.1* 2.2*  PROT 6.6 5.6* 5.4*  ALBUMIN 3.3* 2.7* 2.5*    CBG: No results for input(s): GLUCAP in the last 168 hours.   Recent Results (from the past 240 hour(s))  Resp Panel by RT-PCR (Flu A&B, Covid) Nasopharyngeal Swab     Status: None   Collection Time: 10/29/20  7:26 PM   Specimen: Nasopharyngeal Swab; Nasopharyngeal(NP) swabs in vial transport medium  Result Value Ref Range Status   SARS Coronavirus 2 by RT PCR NEGATIVE NEGATIVE Final    Comment: (NOTE) SARS-CoV-2 target  nucleic acids are NOT DETECTED.  The SARS-CoV-2 RNA is generally detectable in upper respiratory specimens during the acute phase of infection. The lowest concentration of SARS-CoV-2 viral copies this assay can detect is 138 copies/mL. A negative result does not preclude SARS-Cov-2 infection and should not be used as the sole basis for treatment or other patient management decisions. A negative result may occur with  improper specimen collection/handling, submission of specimen other than nasopharyngeal swab, presence of viral mutation(s) within the areas targeted by this assay, and inadequate number of viral copies(<138 copies/mL). A negative result must be combined with clinical observations, patient history, and epidemiological information. The expected result is Negative.  Fact Sheet for Patients:  BloggerCourse.com  Fact Sheet for Healthcare Providers:  SeriousBroker.it  This test is no t yet approved or cleared by the [place] FDA and  has been authorized for detection and/or diagnosis of SARS-CoV-2 by FDA under an Emergency Use Authorization (EUA). This EUA will remain  in effect (meaning this test can be used) for the duration of the COVID-19 declaration under Section 564(b)(1) of the Act, 21 U.S.C.section 360bbb-3(b)(1), unless the authorization is terminated  or revoked sooner.       Influenza A by PCR NEGATIVE NEGATIVE Final   Influenza B by PCR NEGATIVE NEGATIVE Final    Comment: (NOTE) The Xpert Xpress SARS-CoV-2/FLU/RSV plus assay is intended as an aid in the diagnosis of influenza from Nasopharyngeal swab specimens and should not be used as a sole basis for treatment. Nasal washings and aspirates are unacceptable for Xpert Xpress SARS-CoV-2/FLU/RSV testing.  Fact Sheet for Patients: BloggerCourse.com  Fact Sheet for Healthcare  Providers: SeriousBroker.it  This test is not yet approved or cleared by the [place] FDA and has been authorized for detection and/or diagnosis of SARS-CoV-2 by FDA under an Emergency Use Authorization (EUA). This EUA will remain in effect (meaning this test can be used) for the duration of the COVID-19 declaration under Section 564(b)(1) of the Act, 21 U.S.C. section 360bbb-3(b)(1), unless the authorization is terminated or revoked.  Performed at [facility], [address] [place], [place] [address]          Radiology Studies: US Abdomen Limited RUQ (LIVER/GB)  Result Date: 10/30/2020 CLINICAL DATA:  Acute generalized abdominal pain. EXAM: ULTRASOUND ABDOMEN LIMITED RIGHT UPPER QUADRANT COMPARISON:  None. FINDINGS: Gallbladder: No gallstones or wall thickening visualized. No sonographic Murphy sign noted by sonographer. Common bile duct: Diameter: 4 mm which is within normal limits. Liver: No focal lesion identified. Increased echogenicity of hepatic parenchyma is noted suggesting hepatic steatosis. Portal vein is patent on  color Doppler imaging with normal direction of blood flow towards the liver. Other: None. IMPRESSION: Hepatic steatosis. No other abnormality seen in the right upper quadrant of the abdomen. Electronically Signed   By: Lupita Raider M.D.   On: 10/30/2020 08:43        Scheduled Meds: . folic acid  1 mg Oral Daily  . [START ON 10/31/2020] LORazepam  0.5 mg Oral Q6H  . LORazepam  1 mg Oral Q6H  . LORazepam  2 mg Oral Q6H  . multivitamin with minerals  1 tablet Oral Daily  . potassium chloride  40 mEq Oral Q4H  . thiamine  100 mg Oral Daily   Or  . thiamine  100 mg Intravenous Daily   Continuous Infusions:   LOS: 1 day    Time spent: ***    Ramiro Harvest, MD [facility]   To contact the attending provider between 7A-7P or the covering provider during after hours 7P-7A, please log into the web  site [email or web address] and access using universal [place] password for that web site. If you do not have the password, please call the hospital operator.  10/30/2020, 10:57 AM

## 2020-10-30 NOTE — ED Provider Notes (Signed)
[facility] Emergency Department Provider Note  ____________________________________________  Time seen: Approximately 6:46 PM  I have reviewed the triage vital signs and the nursing notes.   HISTORY  Chief Complaint detox    Level 5 Caveat: Portions of the History and Physical including HPI and review of systems are unable to be completely obtained due to patient being a poor historian   HPI Eric Booker is a 60 y.o. male with a history of alcohol dependence who was in the hospital due to alcohol withdrawal, hyponatremia, left AGAINST MEDICAL ADVICE today because he wanted to get his scooter intake at home.  He was walking to where his scooter is parked, and sheriffs found him appearing confused and unsteady on his feet.   Convinced him to come back to the emergency room. Denies drinking alcohol since leaving the hospital.    History reviewed. No pertinent past medical history.   Patient Active Problem List   Diagnosis Date Noted  . Hyponatremia 10/29/2020     History reviewed. No pertinent surgical history.   Prior to Admission medications   Not on File     Allergies Bee venom   No family history on file.  Social History Social History   Tobacco Use  . Smoking status: Current Every Day Smoker  . Smokeless tobacco: Current User  Substance Use Topics  . Alcohol use: Yes    Alcohol/week: 12.0 standard drinks    Types: 12 Cans of beer per week    Review of Systems Level 5 Caveat: Portions of the History and Physical including HPI and review of systems are unable to be completely obtained due to patient being a poor historian   Constitutional:   No known fever.  ENT:   No rhinorrhea. Cardiovascular:   No chest pain or syncope. Respiratory:   No dyspnea or cough. Gastrointestinal:   Negative for abdominal pain, vomiting and diarrhea.  Musculoskeletal:   Negative for focal pain or  swelling ____________________________________________   PHYSICAL EXAM:  VITAL SIGNS: ED Triage Vitals  Enc Vitals Group     BP 10/30/20 1747 100/80     Pulse Rate 10/30/20 1747 (!) 120     Resp 10/30/20 1747 18     Temp 10/30/20 1747 99 F (37.2 C)     Temp Source 10/30/20 1747 Oral     SpO2 10/30/20 1747 97 %     Weight 10/30/20 1749 190 lb (86.2 kg)     Height 10/30/20 1749 6' (1.829 m)     Head Circumference --      Peak Flow --      Pain Score 10/30/20 1749 0     Pain Loc --      Pain Edu? --      Excl. in GC? --     Vital signs reviewed, nursing assessments reviewed.   Constitutional:   Alert and oriented to person and place. Non-toxic appearance. Eyes:   Conjunctivae are normal. EOMI. PERRL. ENT      Head:   Normocephalic and atraumatic.      Nose:   No congestion/rhinnorhea.       Mouth/Throat:   MMM, no pharyngeal erythema. No peritonsillar mass.       Neck:   No meningismus. Full ROM. Hematological/Lymphatic/Immunilogical:   No cervical lymphadenopathy. Cardiovascular:   Tachycardia heart rate 120. Symmetric bilateral radial and DP pulses.  No murmurs. Cap refill less than 2 seconds. Respiratory:   Normal respiratory effort without tachypnea/retractions. Breath sounds  are clear and equal bilaterally. No wheezes/rales/rhonchi. Gastrointestinal:   Soft and nontender. Non distended. There is no CVA tenderness.  No rebound, rigidity, or guarding. Genitourinary:   deferred Musculoskeletal:   Normal range of motion in all extremities. No joint effusions.  No lower extremity tenderness.  No edema. Neurologic:   Normal speech, somewhat confused Motor grossly intact. No acute focal neurologic deficits are appreciated.  Skin:    Skin is warm, dry and intact. No rash noted.  No petechiae, purpura, or bullae.  ____________________________________________    LABS (pertinent positives/negatives) (all labs ordered are listed, but only abnormal results are  displayed) Labs Reviewed  COMPREHENSIVE METABOLIC PANEL - Abnormal; Notable for the following components:      Result Value   Sodium 126 (*)    CO2 19 (*)    Glucose, Bld 124 (*)    BUN <5 (*)    Calcium 8.3 (*)    Albumin 3.1 (*)    AST 408 (*)    ALT 145 (*)    Total Bilirubin 2.6 (*)    All other components within normal limits  CBC - Abnormal; Notable for the following components:   HCT 38.5 (*)    MCHC 36.1 (*)    Platelets 73 (*)    All other components within normal limits  LIPASE, BLOOD  URINALYSIS, COMPLETE (UACMP) WITH MICROSCOPIC   ____________________________________________   EKG    ____________________________________________    RADIOLOGY  CT ABDOMEN PELVIS W CONTRAST  Result Date: 10/30/2020 CLINICAL DATA:  Nausea, vomiting. EXAM: CT ABDOMEN AND PELVIS WITH CONTRAST TECHNIQUE: Multidetector CT imaging of the abdomen and pelvis was performed using the standard protocol following bolus administration of intravenous contrast. CONTRAST:  OMNIPAQUE IOHEXOL 300 MG/ML  SOLN COMPARISON:  May 01, 2019. FINDINGS: Lower chest: No acute abnormality. Hepatobiliary: No gallstones or biliary dilatation is noted. Hepatic steatosis is noted. Pancreas: Unremarkable. No pancreatic ductal dilatation or surrounding inflammatory changes. Spleen: Normal in size without focal abnormality. Adrenals/Urinary Tract: Adrenal glands are unremarkable. Kidneys are normal, without renal calculi, focal lesion, or hydronephrosis. Bladder is unremarkable. Stomach/Bowel: Stomach is within normal limits. Appendix appears normal. No evidence of bowel wall thickening, distention, or inflammatory changes. Vascular/Lymphatic: Aortic atherosclerosis. No enlarged abdominal or pelvic lymph nodes. Reproductive: Prostate is unremarkable. Other: Small fat containing bilateral inguinal hernias are noted. No ascites is noted. Musculoskeletal: No acute or significant osseous findings. IMPRESSION: Hepatic  steatosis. No acute abnormality seen in the abdomen or pelvis. Aortic Atherosclerosis (ICD10-I70.0). Electronically Signed   By: Lupita Raider M.D.   On: 10/30/2020 15:51   US Abdomen Limited RUQ (LIVER/GB)  Result Date: 10/30/2020 CLINICAL DATA:  Acute generalized abdominal pain. EXAM: ULTRASOUND ABDOMEN LIMITED RIGHT UPPER QUADRANT COMPARISON:  None. FINDINGS: Gallbladder: No gallstones or wall thickening visualized. No sonographic Murphy sign noted by sonographer. Common bile duct: Diameter: 4 mm which is within normal limits. Liver: No focal lesion identified. Increased echogenicity of hepatic parenchyma is noted suggesting hepatic steatosis. Portal vein is patent on color Doppler imaging with normal direction of blood flow towards the liver. Other: None. IMPRESSION: Hepatic steatosis. No other abnormality seen in the right upper quadrant of the abdomen. Electronically Signed   By: Lupita Raider M.D.   On: 10/30/2020 08:43    ____________________________________________   PROCEDURES Procedures  ____________________________________________    CLINICAL IMPRESSION / ASSESSMENT AND PLAN / ED COURSE  Medications ordered in the ED: Medications  LORazepam (ATIVAN) injection 0-4 mg (has no  administration in time range)    Or  LORazepam (ATIVAN) tablet 0-4 mg (has no administration in time range)  LORazepam (ATIVAN) injection 0-4 mg (has no administration in time range)    Or  LORazepam (ATIVAN) tablet 0-4 mg (has no administration in time range)  thiamine tablet 100 mg (has no administration in time range)    Or  thiamine (B-1) injection 100 mg (has no administration in time range)  sodium chloride 0.9 % bolus 1,000 mL (has no administration in time range)    Pertinent labs & imaging results that were available during my care of the patient were reviewed by me and considered in my medical decision making (see chart for details).   Eric Booker was evaluated in Emergency  Department on 10/30/2020 for the symptoms described in the history of present illness. He was evaluated in the context of the global COVID-19 pandemic, which necessitated consideration that the patient might be at risk for infection with the SARS-CoV-2 virus that causes COVID-19. Institutional protocols and algorithms that pertain to the evaluation of patients at risk for COVID-19 are in a state of rapid change based on information released by regulatory bodies including the CDC and federal and state organizations. These policies and algorithms were followed during the patient's care in the ED.   Patient brought back to ED with confusion and unsteady gait in the setting of alcohol withdrawal.  He is worsened since leaving the hospital.  He exhibits some hallucinations, diaphoresis, tachycardia.  Will start on CIWA protocol, plan to readmit to continue care.  Patient states he is now agreeable to being admitted back to the hospital.      ____________________________________________   FINAL CLINICAL IMPRESSION(S) / ED DIAGNOSES    Final diagnoses:  None     ED Discharge Orders    None      Portions of this note were generated with dragon dictation software. Dictation errors may occur despite best attempts at proofreading.   Sharman Cheek, MD 10/30/20 [phone]

## 2020-10-31 ENCOUNTER — Encounter: Payer: Self-pay | Admitting: Family Medicine

## 2020-10-31 DIAGNOSIS — E871 Hypo-osmolality and hyponatremia: Secondary | ICD-10-CM | POA: Diagnosis not present

## 2020-10-31 LAB — COMPREHENSIVE METABOLIC PANEL
ALT: 111 U/L — ABNORMAL HIGH (ref 0–44)
AST: 275 U/L — ABNORMAL HIGH (ref 15–41)
Albumin: 2.5 g/dL — ABNORMAL LOW (ref 3.5–5.0)
Alkaline Phosphatase: 64 U/L (ref 38–126)
Anion gap: 5 (ref 5–15)
BUN: <5 mg/dL — ABNORMAL LOW (ref 6–20)
CO2: 24 mmol/L (ref 22–32)
Calcium: 7.9 mg/dL — ABNORMAL LOW (ref 8.9–10.3)
Chloride: 104 mmol/L (ref 98–111)
Creatinine, Ser: 0.76 mg/dL (ref 0.61–1.24)
GFR, Estimated: >60 mL/min (ref >60)
Glucose, Bld: 97 mg/dL (ref 70–99)
Potassium: 3.4 mmol/L — ABNORMAL LOW (ref 3.5–5.1)
Sodium: 133 mmol/L — ABNORMAL LOW (ref 135–145)
Total Bilirubin: 2 mg/dL — ABNORMAL HIGH (ref 0.3–1.2)
Total Protein: 5.4 g/dL — ABNORMAL LOW (ref 6.5–8.1)

## 2020-10-31 LAB — RESP PANEL BY RT-PCR (FLU A&B, COVID) ARPGX2
Influenza A by PCR: NEGATIVE
Influenza B by PCR: NEGATIVE
SARS Coronavirus 2 by RT PCR: NEGATIVE

## 2020-10-31 LAB — CBC
HCT: 34 % — ABNORMAL LOW (ref 39.0–52.0)
Hemoglobin: 12.1 g/dL — ABNORMAL LOW (ref 13.0–17.0)
MCH: 32.7 pg (ref 26.0–34.0)
MCHC: 35.6 g/dL (ref 30.0–36.0)
MCV: 91.9 fL (ref 80.0–100.0)
Platelets: 61 10*3/uL — ABNORMAL LOW (ref 150–400)
RBC: 3.7 MIL/uL — ABNORMAL LOW (ref 4.22–5.81)
RDW: 13.5 % (ref 11.5–15.5)
WBC: 5.6 10*3/uL (ref 4.0–10.5)
nRBC: 0 % (ref 0.0–0.2)

## 2020-10-31 MED ORDER — LORAZEPAM 2 MG PO TABS
2.0000 mg | ORAL_TABLET | Freq: Four times a day (QID) | ORAL | Status: DC
  Administered 2020-10-31: 2 mg via ORAL
  Filled 2020-10-31: qty 1

## 2020-10-31 MED ORDER — LORAZEPAM 1 MG PO TABS: 1.0000 mg | ORAL_TABLET | Freq: Four times a day (QID) | ORAL | Status: DC

## 2020-10-31 MED ORDER — LORAZEPAM 0.5 MG PO TABS: 0.5000 mg | ORAL_TABLET | Freq: Four times a day (QID) | ORAL | Status: DC

## 2020-10-31 MED ORDER — LORAZEPAM 1 MG PO TABS
1.0000 mg | ORAL_TABLET | Freq: Four times a day (QID) | ORAL | Status: AC
  Administered 2020-10-31 (×3): 1 mg via ORAL
  Filled 2020-10-31 (×2): qty 1

## 2020-10-31 MED ORDER — POTASSIUM CHLORIDE CRYS ER 20 MEQ PO TBCR
20.0000 meq | EXTENDED_RELEASE_TABLET | Freq: Once | ORAL | Status: AC
  Administered 2020-10-31: 09:00:00 20 meq via ORAL
  Filled 2020-10-31: qty 1

## 2020-10-31 MED ORDER — LORAZEPAM 0.5 MG PO TABS
0.5000 mg | ORAL_TABLET | Freq: Four times a day (QID) | ORAL | Status: DC
  Administered 2020-11-01: 0.5 mg via ORAL
  Filled 2020-10-31: qty 1

## 2020-10-31 NOTE — Plan of Care (Signed)
Shift Summary: Pt admitted from ED overnight. Oriented to self, thought we were in a motel and then a school. Very unsteady gait when ambulating to bathroom, leaning into walls. No c/o pain. Ativan given per MAR, CIWA consistently 15-18 overnight. Pt removed IV, new PIV placed and wrapped for protection. Fall/seizure precautions in place, rounding performed, needs/concerns addressed during shift.   Problem: Education: Goal: Knowledge of General Education information will improve Description: Including pain rating scale, medication(s)/side effects and non-pharmacologic comfort measures 10/31/2020 0452 by Michail Sermon, RN Outcome: Progressing 10/31/2020 0023 by Michail Sermon, RN Outcome: Progressing   Problem: Health Behavior/Discharge Planning: Goal: Ability to manage health-related needs will improve 10/31/2020 0452 by Michail Sermon, RN Outcome: Progressing 10/31/2020 0023 by Michail Sermon, RN Outcome: Progressing   Problem: Clinical Measurements: Goal: Ability to maintain clinical measurements within normal limits will improve 10/31/2020 0452 by Michail Sermon, RN Outcome: Progressing 10/31/2020 0023 by Michail Sermon, RN Outcome: Progressing Goal: Will remain free from infection 10/31/2020 0452 by Michail Sermon, RN Outcome: Progressing 10/31/2020 0023 by Michail Sermon, RN Outcome: Progressing Goal: Diagnostic test results will improve 10/31/2020 0452 by Michail Sermon, RN Outcome: Progressing 10/31/2020 0023 by Michail Sermon, RN Outcome: Progressing Goal: Respiratory complications will improve 10/31/2020 0452 by Michail Sermon, RN Outcome: Progressing 10/31/2020 0023 by Michail Sermon, RN Outcome: Progressing Goal: Cardiovascular complication will be avoided 10/31/2020 0452 by Michail Sermon, RN Outcome: Progressing 10/31/2020 0023 by Michail Sermon, RN Outcome: Progressing   Problem: Activity: Goal: Risk for activity intolerance will decrease 10/31/2020 0452  by Michail Sermon, RN Outcome: Progressing 10/31/2020 0023 by Michail Sermon, RN Outcome: Progressing   Problem: Nutrition: Goal: Adequate nutrition will be maintained 10/31/2020 0452 by Michail Sermon, RN Outcome: Progressing 10/31/2020 0023 by Michail Sermon, RN Outcome: Progressing   Problem: Coping: Goal: Level of anxiety will decrease 10/31/2020 0452 by Michail Sermon, RN Outcome: Progressing 10/31/2020 0023 by Michail Sermon, RN Outcome: Progressing   Problem: Elimination: Goal: Will not experience complications related to bowel motility 10/31/2020 0452 by Michail Sermon, RN Outcome: Progressing 10/31/2020 0023 by Michail Sermon, RN Outcome: Progressing Goal: Will not experience complications related to urinary retention 10/31/2020 0452 by Michail Sermon, RN Outcome: Progressing 10/31/2020 0023 by Michail Sermon, RN Outcome: Progressing   Problem: Pain Managment: Goal: General experience of comfort will improve 10/31/2020 0452 by Michail Sermon, RN Outcome: Progressing 10/31/2020 0023 by Michail Sermon, RN Outcome: Progressing   Problem: Safety: Goal: Ability to remain free from injury will improve 10/31/2020 0452 by Michail Sermon, RN Outcome: Progressing 10/31/2020 0023 by Michail Sermon, RN Outcome: Progressing   Problem: Skin Integrity: Goal: Risk for impaired skin integrity will decrease 10/31/2020 0452 by Michail Sermon, RN Outcome: Progressing 10/31/2020 0023 by Michail Sermon, RN Outcome: Progressing   Problem: Education: Goal: Knowledge of disease or condition will improve 10/31/2020 0452 by Michail Sermon, RN Outcome: Progressing 10/31/2020 0023 by Michail Sermon, RN Outcome: Progressing Goal: Understanding of discharge needs will improve 10/31/2020 0452 by Michail Sermon, RN Outcome: Progressing 10/31/2020 0023 by Michail Sermon, RN Outcome: Progressing   Problem: Health Behavior/Discharge Planning: Goal: Ability to identify changes  in lifestyle to reduce recurrence of condition will improve 10/31/2020 0452 by Michail Sermon, RN Outcome: Progressing 10/31/2020 0023 by Michail Sermon, RN Outcome: Progressing Goal: Identification of resources available to assist in meeting health care needs will improve 10/31/2020 0452 by Michail Sermon, RN Outcome: Progressing 10/31/2020 0023 by Michail Sermon, RN Outcome: Progressing   Problem: Physical Regulation: Goal: Complications related to the disease process, condition  or treatment will be avoided or minimized 10/31/2020 0452 by Michail Sermon, RN Outcome: Progressing 10/31/2020 0023 by Michail Sermon, RN Outcome: Progressing   Problem: Safety: Goal: Ability to remain free from injury will improve 10/31/2020 0452 by Michail Sermon, RN Outcome: Progressing 10/31/2020 0023 by Michail Sermon, RN Outcome: Progressing

## 2020-10-31 NOTE — Progress Notes (Signed)
PROGRESS NOTE  Eric OrganStuart Vida  NWG:[id number] DOB: 09-20-1960 DOA: 10/30/2020 PCP: Oneita HurtPcp, No   Brief Narrative: Eric Booker is a 60 y.o. male with a history of beer potomania and alcohol abuse with withdrawal who initially presented to the ED 4/28 with nausea and vomiting found to have hyponatremia and was admitted with CIWA protocol in place with IV fluids. He left AMA the next day only to be brought back by police later that evening for appearing unsteady and confused. He was diaphoretic, tachycardic and having hallucinations in the ED, readmitted 4/29, started on IVF with improvement in hyponatremia and stable alcohol withdrawal requiring scheduled and prn ativan.   Assessment & Plan: Active Problems:   Hyponatremia  Alcohol withdrawal: EtOH still negative on representation. - Continue scheduled ativan which appears to be controlling symptoms without sedation. The patient remains somewhat confused. Thiamine will continue  Hyponatremia: Likely beer potomania, inadequate solute intake, and dehydration due to recent vomiting. TSH wnl. - This has resolved with IVF and fluid restriction. Appears euvolemic, so we'll not continue IVF for now.  Alcoholic hepatitis, LFT elevation: Hepatic steatosis seen on CT A/P and U/S on 4/29. Improving LFTs.  - Abstain from alcohol, no indication for prednisolone. - Check PT/INR.  Thrombocytopenia: Likely related to alcohol abuse and hepatic steatosis. - Monitoring.  Hypokalemia:  - Supplement and monitor.  DVT prophylaxis: SCDs with thrombocytopenia Code Status: Full Family Communication: None at bedside Disposition Plan:  Status is: Inpatient  Remains inpatient appropriate because:Altered mental status   Dispo: The patient is from: Home              Anticipated d/c is to: Home              Patient currently is not medically stable to d/c.   Difficult to place patient No       Consultants:   None  Procedures:    None  Antimicrobials:  None   Subjective: Continues to be disoriented but eating better without any vomiting. No chest pain or dyspnea.   Objective: Vitals:   10/30/20 2006 10/30/20 2238 10/30/20 2358 10/31/20 0758  BP: 117/78 126/89 130/77 121/90  Pulse: (!) 102 (!) 108 81 89  Resp:  17 19 16   Temp:  98.1 F (36.7 C) 98 F (36.7 C) 98.2 F (36.8 C)  TempSrc:  Oral Oral   SpO2:  100% 100% 100%  Weight:  79.1 kg    Height:  6' 0.01" (1.829 m)      Intake/Output Summary (Last 24 hours) at 10/31/2020 1127 Last data filed at 10/31/2020 1033 Gross per 24 hour  Intake 1100 ml  Output 700 ml  Net 400 ml   Filed Weights   10/30/20 1749 10/30/20 2238  Weight: 86.2 kg 79.1 kg    Gen: 60 y.o. male in no distress, chronically ill-appearing Pulm: Non-labored breathing. Clear to auscultation bilaterally.  CV: Regular rate and rhythm. No murmur, rub, or gallop. No JVD, no pedal edema. GI: Abdomen soft, non-tender, non-distended, with normoactive bowel sounds. No organomegaly or masses felt. Ext: Warm, no deformities Skin: No rashes, lesions or ulcers on visualized skin Neuro: Alert and partially oriented. No focal neurological deficits. Psych: Judgement and insight appear marginal. Impaired short term and long term recall. ?if confabulating. Mood & affect appropriate.   Data Reviewed: I have personally reviewed following labs and imaging studies  CBC: Recent Labs  Lab 10/29/20 2144 10/30/20 0500 10/30/20 1754 10/30/20 2247 10/31/20 0446  WBC 5.7 5.0 5.8  6.0 5.6  HGB 13.0 12.5* 13.9 12.8* 12.1*  HCT 36.2* 34.5* 38.5* 36.6* 34.0*  MCV 89.8 89.6 89.5 91.7 91.9  PLT 86* 77* 73* 67* 61*   Basic Metabolic Panel: Recent Labs  Lab 10/29/20 2144 10/30/20 0500 10/30/20 0939 10/30/20 1754 10/30/20 2247 10/31/20 0446  NA 127* 128*  --  126* 132* 133*  K 3.5 3.1*  --  4.1 3.9 3.4*  CL 97* 98  --  98 101 104  CO2 21* 22  --  19* 27 24  GLUCOSE 136* 98  --  124* 105* 97   BUN 8 <5*  --  <5* <5* <5*  CREATININE 0.87 0.69  --  0.70 0.75 0.76  CALCIUM 7.4* 7.5*  --  8.3* 8.1* 7.9*  MG 1.4*  --  2.3  --  1.6*  --   PHOS 2.7  --   --   --  1.7*  --    GFR: Estimated Creatinine Clearance: 109.1 mL/min (by C-G formula based on SCr of 0.76 mg/dL). Liver Function Tests: Recent Labs  Lab 10/29/20 2144 10/30/20 0500 10/30/20 1754 10/30/20 2247 10/31/20 0446  AST 466* 408* 408* 328* 275*  ALT 129* 122* 145* 126* 111*  ALKPHOS 74 72 79 70 64  BILITOT 2.1* 2.2* 2.6* 1.9* 2.0*  PROT 5.6* 5.4* 6.5 5.8* 5.4*  ALBUMIN 2.7* 2.5* 3.1* 2.9* 2.5*   Recent Labs  Lab 10/29/20 1511 10/30/20 0939 10/30/20 1754  LIPASE 54* 51 38   No results for input(s): AMMONIA in the last 168 hours. Coagulation Profile: No results for input(s): INR, PROTIME in the last 168 hours. Cardiac Enzymes: No results for input(s): CKTOTAL, CKMB, CKMBINDEX, TROPONINI in the last 168 hours. BNP (last 3 results) No results for input(s): PROBNP in the last 8760 hours. HbA1C: No results for input(s): HGBA1C in the last 72 hours. CBG: No results for input(s): GLUCAP in the last 168 hours. Lipid Profile: No results for input(s): CHOL, HDL, LDLCALC, TRIG, CHOLHDL, LDLDIRECT in the last 72 hours. Thyroid Function Tests: No results for input(s): TSH, T4TOTAL, FREET4, T3FREE, THYROIDAB in the last 72 hours. Anemia Panel: No results for input(s): VITAMINB12, FOLATE, FERRITIN, TIBC, IRON, RETICCTPCT in the last 72 hours. Urine analysis:    Component Value Date/Time   COLORURINE YELLOW (A) 10/30/2020 1941   APPEARANCEUR CLEAR (A) 10/30/2020 1941   APPEARANCEUR Clear 01/20/2013 1349   LABSPEC 1.014 10/30/2020 1941   LABSPEC 1.009 01/20/2013 1349   PHURINE 7.0 10/30/2020 1941   GLUCOSEU NEGATIVE 10/30/2020 1941   GLUCOSEU Negative 01/20/2013 1349   HGBUR NEGATIVE 10/30/2020 1941   BILIRUBINUR NEGATIVE 10/30/2020 1941   BILIRUBINUR Negative 01/20/2013 1349   KETONESUR NEGATIVE 10/30/2020  1941   PROTEINUR NEGATIVE 10/30/2020 1941   NITRITE NEGATIVE 10/30/2020 1941   LEUKOCYTESUR NEGATIVE 10/30/2020 1941   LEUKOCYTESUR Negative 01/20/2013 1349   Recent Results (from the past 240 hour(s))  Resp Panel by RT-PCR (Flu A&B, Covid) Nasopharyngeal Swab     Status: None   Collection Time: 10/29/20  7:26 PM   Specimen: Nasopharyngeal Swab; Nasopharyngeal(NP) swabs in vial transport medium  Result Value Ref Range Status   SARS Coronavirus 2 by RT PCR NEGATIVE NEGATIVE Final    Comment: (NOTE) SARS-CoV-2 target nucleic acids are NOT DETECTED.  The SARS-CoV-2 RNA is generally detectable in upper respiratory specimens during the acute phase of infection. The lowest concentration of SARS-CoV-2 viral copies this assay can detect is 138 copies/mL. A negative result does not preclude SARS-Cov-2  infection and should not be used as the sole basis for treatment or other patient management decisions. A negative result may occur with  improper specimen collection/handling, submission of specimen other than nasopharyngeal swab, presence of viral mutation(s) within the areas targeted by this assay, and inadequate number of viral copies(<138 copies/mL). A negative result must be combined with clinical observations, patient history, and epidemiological information. The expected result is Negative.  Fact Sheet for Patients:  BloggerCourse.com  Fact Sheet for Healthcare Providers:  SeriousBroker.it  This test is no t yet approved or cleared by the [place] FDA and  has been authorized for detection and/or diagnosis of SARS-CoV-2 by FDA under an Emergency Use Authorization (EUA). This EUA will remain  in effect (meaning this test can be used) for the duration of the COVID-19 declaration under Section 564(b)(1) of the Act, 21 U.S.C.section 360bbb-3(b)(1), unless the authorization is terminated  or revoked sooner.       Influenza A by  PCR NEGATIVE NEGATIVE Final   Influenza B by PCR NEGATIVE NEGATIVE Final    Comment: (NOTE) The Xpert Xpress SARS-CoV-2/FLU/RSV plus assay is intended as an aid in the diagnosis of influenza from Nasopharyngeal swab specimens and should not be used as a sole basis for treatment. Nasal washings and aspirates are unacceptable for Xpert Xpress SARS-CoV-2/FLU/RSV testing.  Fact Sheet for Patients: BloggerCourse.com  Fact Sheet for Healthcare Providers: SeriousBroker.it  This test is not yet approved or cleared by the [place] FDA and has been authorized for detection and/or diagnosis of SARS-CoV-2 by FDA under an Emergency Use Authorization (EUA). This EUA will remain in effect (meaning this test can be used) for the duration of the COVID-19 declaration under Section 564(b)(1) of the Act, 21 U.S.C. section 360bbb-3(b)(1), unless the authorization is terminated or revoked.  Performed at [facility], [address] [place], [place] [address]   Resp Panel by RT-PCR (Flu A&B, Covid) Nasopharyngeal Swab     Status: None   Collection Time: 10/30/20  8:20 PM   Specimen: Nasopharyngeal Swab; Nasopharyngeal(NP) swabs in vial transport medium  Result Value Ref Range Status   SARS Coronavirus 2 by RT PCR NEGATIVE NEGATIVE Final    Comment: (NOTE) SARS-CoV-2 target nucleic acids are NOT DETECTED.  The SARS-CoV-2 RNA is generally detectable in upper respiratory specimens during the acute phase of infection. The lowest concentration of SARS-CoV-2 viral copies this assay can detect is 138 copies/mL. A negative result does not preclude SARS-Cov-2 infection and should not be used as the sole basis for treatment or other patient management decisions. A negative result may occur with  improper specimen collection/handling, submission of specimen other than nasopharyngeal swab, presence of viral mutation(s) within the areas targeted  by this assay, and inadequate number of viral copies(<138 copies/mL). A negative result must be combined with clinical observations, patient history, and epidemiological information. The expected result is Negative.  Fact Sheet for Patients:  BloggerCourse.com  Fact Sheet for Healthcare Providers:  SeriousBroker.it  This test is no t yet approved or cleared by the [place] FDA and  has been authorized for detection and/or diagnosis of SARS-CoV-2 by FDA under an Emergency Use Authorization (EUA). This EUA will remain  in effect (meaning this test can be used) for the duration of the COVID-19 declaration under Section 564(b)(1) of the Act, 21 U.S.C.section 360bbb-3(b)(1), unless the authorization is terminated  or revoked sooner.       Influenza A by PCR NEGATIVE NEGATIVE Final   Influenza  B by PCR NEGATIVE NEGATIVE Final    Comment: (NOTE) The Xpert Xpress SARS-CoV-2/FLU/RSV plus assay is intended as an aid in the diagnosis of influenza from Nasopharyngeal swab specimens and should not be used as a sole basis for treatment. Nasal washings and aspirates are unacceptable for Xpert Xpress SARS-CoV-2/FLU/RSV testing.  Fact Sheet for Patients: BloggerCourse.com  Fact Sheet for Healthcare Providers: SeriousBroker.it  This test is not yet approved or cleared by the [place] FDA and has been authorized for detection and/or diagnosis of SARS-CoV-2 by FDA under an Emergency Use Authorization (EUA). This EUA will remain in effect (meaning this test can be used) for the duration of the COVID-19 declaration under Section 564(b)(1) of the Act, 21 U.S.C. section 360bbb-3(b)(1), unless the authorization is terminated or revoked.  Performed at [facility], [address], [place], [place] [address]       Radiology Studies: CT ABDOMEN PELVIS W CONTRAST  Result Date:  10/30/2020 CLINICAL DATA:  Nausea, vomiting. EXAM: CT ABDOMEN AND PELVIS WITH CONTRAST TECHNIQUE: Multidetector CT imaging of the abdomen and pelvis was performed using the standard protocol following bolus administration of intravenous contrast. CONTRAST:  OMNIPAQUE IOHEXOL 300 MG/ML  SOLN COMPARISON:  May 01, 2019. FINDINGS: Lower chest: No acute abnormality. Hepatobiliary: No gallstones or biliary dilatation is noted. Hepatic steatosis is noted. Pancreas: Unremarkable. No pancreatic ductal dilatation or surrounding inflammatory changes. Spleen: Normal in size without focal abnormality. Adrenals/Urinary Tract: Adrenal glands are unremarkable. Kidneys are normal, without renal calculi, focal lesion, or hydronephrosis. Bladder is unremarkable. Stomach/Bowel: Stomach is within normal limits. Appendix appears normal. No evidence of bowel wall thickening, distention, or inflammatory changes. Vascular/Lymphatic: Aortic atherosclerosis. No enlarged abdominal or pelvic lymph nodes. Reproductive: Prostate is unremarkable. Other: Small fat containing bilateral inguinal hernias are noted. No ascites is noted. Musculoskeletal: No acute or significant osseous findings. IMPRESSION: Hepatic steatosis. No acute abnormality seen in the abdomen or pelvis. Aortic Atherosclerosis (ICD10-I70.0). Electronically Signed   By: Lupita Raider M.D.   On: 10/30/2020 15:51   US Abdomen Limited RUQ (LIVER/GB)  Result Date: 10/30/2020 CLINICAL DATA:  Acute generalized abdominal pain. EXAM: ULTRASOUND ABDOMEN LIMITED RIGHT UPPER QUADRANT COMPARISON:  None. FINDINGS: Gallbladder: No gallstones or wall thickening visualized. No sonographic Murphy sign noted by sonographer. Common bile duct: Diameter: 4 mm which is within normal limits. Liver: No focal lesion identified. Increased echogenicity of hepatic parenchyma is noted suggesting hepatic steatosis. Portal vein is patent on color Doppler imaging with normal direction of blood  flow towards the liver. Other: None. IMPRESSION: Hepatic steatosis. No other abnormality seen in the right upper quadrant of the abdomen. Electronically Signed   By: Lupita Raider M.D.   On: 10/30/2020 08:43    Scheduled Meds: . folic acid  1 mg Oral Daily  . [START ON 11/02/2020] LORazepam  0.5 mg Oral Q6H  . [START ON 11/01/2020] LORazepam  1 mg Oral Q6H  . LORazepam  2 mg Oral Q6H  . multivitamin with minerals  1 tablet Oral Daily  . thiamine  100 mg Oral Daily   Or  . thiamine  100 mg Intravenous Daily   Continuous Infusions:   LOS: 1 day   Time spent: 25 minutes.  Tyrone Nine, MD [facility] [email or web address] 10/31/2020, 11:27 AM

## 2020-11-01 ENCOUNTER — Encounter: Payer: Self-pay | Admitting: Family Medicine

## 2020-11-01 DIAGNOSIS — K7010 Alcoholic hepatitis without ascites: Secondary | ICD-10-CM | POA: Diagnosis present

## 2020-11-01 DIAGNOSIS — G9341 Metabolic encephalopathy: Secondary | ICD-10-CM | POA: Diagnosis present

## 2020-11-01 DIAGNOSIS — F10230 Alcohol dependence with withdrawal, uncomplicated: Secondary | ICD-10-CM | POA: Diagnosis present

## 2020-11-01 DIAGNOSIS — E871 Hypo-osmolality and hyponatremia: Secondary | ICD-10-CM | POA: Diagnosis not present

## 2020-11-01 LAB — CBC
HCT: 38.3 % — ABNORMAL LOW (ref 39.0–52.0)
Hemoglobin: 13.2 g/dL (ref 13.0–17.0)
MCH: 31.9 pg (ref 26.0–34.0)
MCHC: 34.5 g/dL (ref 30.0–36.0)
MCV: 92.5 fL (ref 80.0–100.0)
Platelets: 71 10*3/uL — ABNORMAL LOW (ref 150–400)
RBC: 4.14 MIL/uL — ABNORMAL LOW (ref 4.22–5.81)
RDW: 13.9 % (ref 11.5–15.5)
WBC: 5.9 10*3/uL (ref 4.0–10.5)
nRBC: 0 % (ref 0.0–0.2)

## 2020-11-01 LAB — COMPREHENSIVE METABOLIC PANEL
ALT: 115 U/L — ABNORMAL HIGH (ref 0–44)
AST: 229 U/L — ABNORMAL HIGH (ref 15–41)
Albumin: 2.6 g/dL — ABNORMAL LOW (ref 3.5–5.0)
Alkaline Phosphatase: 68 U/L (ref 38–126)
Anion gap: 8 (ref 5–15)
BUN: <5 mg/dL — ABNORMAL LOW (ref 6–20)
CO2: 24 mmol/L (ref 22–32)
Calcium: 8.1 mg/dL — ABNORMAL LOW (ref 8.9–10.3)
Chloride: 102 mmol/L (ref 98–111)
Creatinine, Ser: 0.66 mg/dL (ref 0.61–1.24)
GFR, Estimated: >60 mL/min (ref >60)
Glucose, Bld: 89 mg/dL (ref 70–99)
Potassium: 4.2 mmol/L (ref 3.5–5.1)
Sodium: 134 mmol/L — ABNORMAL LOW (ref 135–145)
Total Bilirubin: 2 mg/dL — ABNORMAL HIGH (ref 0.3–1.2)
Total Protein: 5.5 g/dL — ABNORMAL LOW (ref 6.5–8.1)

## 2020-11-01 LAB — PROTIME-INR
INR: 1 (ref 0.8–1.2)
Prothrombin Time: 12.8 seconds (ref 11.4–15.2)

## 2020-11-01 NOTE — Plan of Care (Signed)
  Problem: Clinical Measurements: Goal: Ability to maintain clinical measurements within normal limits will improve Outcome: Progressing Goal: Will remain free from infection Outcome: Progressing Goal: Diagnostic test results will improve Outcome: Progressing Goal: Respiratory complications will improve Outcome: Progressing Goal: Cardiovascular complication will be avoided Outcome: Progressing   Problem: Nutrition: Goal: Adequate nutrition will be maintained Outcome: Progressing   Problem: Coping: Goal: Level of anxiety will decrease Outcome: Progressing   Problem: Elimination: Goal: Will not experience complications related to bowel motility Outcome: Progressing Goal: Will not experience complications related to urinary retention Outcome: Progressing   Problem: Pain Managment: Goal: General experience of comfort will improve Outcome: Progressing   Problem: Safety: Goal: Ability to remain free from injury will improve Outcome: Progressing   Problem: Skin Integrity: Goal: Risk for impaired skin integrity will decrease Outcome: Progressing   Problem: Education: Goal: Knowledge of disease or condition will improve Outcome: Progressing Goal: Understanding of discharge needs will improve Outcome: Progressing   Problem: Health Behavior/Discharge Planning: Goal: Ability to identify changes in lifestyle to reduce recurrence of condition will improve Outcome: Progressing Goal: Identification of resources available to assist in meeting health care needs will improve Outcome: Progressing   Problem: Physical Regulation: Goal: Complications related to the disease process, condition or treatment will be avoided or minimized Outcome: Progressing   Problem: Safety: Goal: Ability to remain free from injury will improve Outcome: Progressing   Problem: Education: Goal: Knowledge of General Education information will improve Description: Including pain rating scale,  medication(s)/side effects and non-pharmacologic comfort measures Outcome: Not Progressing  Pt more alert today; A&O to person, place and time Problem: Health Behavior/Discharge Planning: Goal: Ability to manage health-related needs will improve Outcome: Not Progressing   Problem: Activity: Goal: Risk for activity intolerance will decrease Outcome: Not Progressing  Physical Therapy Evaluation pending

## 2020-11-01 NOTE — Plan of Care (Signed)

## 2020-11-01 NOTE — Progress Notes (Signed)
PROGRESS NOTE  Eric Booker  MBW:[id number] DOB: Aug 26, 1960 DOA: 10/30/2020 PCP: Oneita Hurt, No   Brief Narrative: Eric Booker is a 60 y.o. male with a history of beer potomania and alcohol abuse with withdrawal who initially presented to the ED 4/28 with nausea and vomiting found to have hyponatremia and was admitted with CIWA protocol in place with IV fluids. He left AMA the next day only to be brought back by police later that evening for appearing unsteady and confused. He was diaphoretic, tachycardic and having hallucinations in the ED, readmitted 4/29, started on IVF with improvement in hyponatremia and stable alcohol withdrawal requiring scheduled and prn ativan.   Assessment & Plan: Active Problems:   Hyponatremia  Alcohol withdrawal: EtOH still negative on representation. - Continue scheduled ativan, taper accelerated. Will DC scheduled doses this PM. Continue prn.  Acute metabolic encephalopathy:  - Improving, continue thiamine and withdrawal management as above.   Hyponatremia: Likely beer potomania, inadequate solute intake, and dehydration due to recent vomiting. TSH wnl. - Significantly improved with IVF and fluid restriction. Appears euvolemic, so we'll not continue IVF for now.  Alcoholic hepatitis, LFT elevation: Hepatic steatosis seen on CT A/P and U/S on 4/29. Improving LFTs. INR 1.0. TBili settling at 2.0.  - Abstain from alcohol, no indication for prednisolone. LFTs improved, still AST > ALT.  Thrombocytopenia: Likely related to alcohol abuse and hepatic steatosis. - Has remained stable without evidence of bleeding.  Hypokalemia:  - Supplemented    DVT prophylaxis: SCDs with thrombocytopenia Code Status: Full Family Communication: None at bedside Disposition Plan:  Status is: Inpatient  Remains inpatient appropriate because:Altered mental status, improving but not at baseline, possible DC 5/2.   Dispo: The patient is from: Home              Anticipated d/c is  to: Home              Patient currently is not medically stable to d/c.   Difficult to place patient No  Consultants:   None  Procedures:   None  Antimicrobials:  None   Subjective: Feels his tremor is improved, feels less sedated today, wants to walk around more. No chest pain or dyspnea. Anxiety controlled. Eating well.   Objective: Vitals:   10/31/20 1630 10/31/20 1936 10/31/20 2339 11/01/20 0455  BP: 105/61 (!) 129/93 140/90 119/69  Pulse: 71 90 98 72  Resp: 18 18 18 16   Temp: 97.8 F (36.6 C) 98.1 F (36.7 C) 98 F (36.7 C) 98.5 F (36.9 C)  TempSrc:  Oral Oral Axillary  SpO2: 98% 100% 100% 99%  Weight:      Height:        Intake/Output Summary (Last 24 hours) at 11/01/2020 1113 Last data filed at 11/01/2020 1025 Gross per 24 hour  Intake 720 ml  Output 475 ml  Net 245 ml   Filed Weights   10/30/20 1749 10/30/20 2238  Weight: 86.2 kg 79.1 kg   Gen: 60 y.o. male in no distress Pulm: Nonlabored breathing room air. Clear. CV: Regular rate and rhythm, no tachycardia. No murmur, rub, or gallop. No JVD, no dependent edema. GI: Abdomen soft, non-tender, non-distended, with normoactive bowel sounds.  Ext: Warm, R index finger amputated, no other deformities Skin: No rashes, lesions or ulcers on visualized skin. Neuro: Much more alert and oriented today, fair recall of prior events (improved). Mild tremor without focal weakness or numbness. Psych: Judgement and insight appear fair. Mood euthymic & affect congruent. Behavior  is appropriate.    Data Reviewed: I have personally reviewed following labs and imaging studies  CBC: Recent Labs  Lab 10/30/20 0500 10/30/20 1754 10/30/20 2247 10/31/20 0446 11/01/20 0620  WBC 5.0 5.8 6.0 5.6 5.9  HGB 12.5* 13.9 12.8* 12.1* 13.2  HCT 34.5* 38.5* 36.6* 34.0* 38.3*  MCV 89.6 89.5 91.7 91.9 92.5  PLT 77* 73* 67* 61* 71*   Basic Metabolic Panel: Recent Labs  Lab 10/29/20 2144 10/30/20 0500 10/30/20 0939  10/30/20 1754 10/30/20 2247 10/31/20 0446 11/01/20 0620  NA 127* 128*  --  126* 132* 133* 134*  K 3.5 3.1*  --  4.1 3.9 3.4* 4.2  CL 97* 98  --  98 101 104 102  CO2 21* 22  --  19* 27 24 24   GLUCOSE 136* 98  --  124* 105* 97 89  BUN 8 <5*  --  <5* <5* <5* <5*  CREATININE 0.87 0.69  --  0.70 0.75 0.76 0.66  CALCIUM 7.4* 7.5*  --  8.3* 8.1* 7.9* 8.1*  MG 1.4*  --  2.3  --  1.6*  --   --   PHOS 2.7  --   --   --  1.7*  --   --    GFR: Estimated Creatinine Clearance: 109.1 mL/min (by C-G formula based on SCr of 0.66 mg/dL). Liver Function Tests: Recent Labs  Lab 10/30/20 0500 10/30/20 1754 10/30/20 2247 10/31/20 0446 11/01/20 0620  AST 408* 408* 328* 275* 229*  ALT 122* 145* 126* 111* 115*  ALKPHOS 72 79 70 64 68  BILITOT 2.2* 2.6* 1.9* 2.0* 2.0*  PROT 5.4* 6.5 5.8* 5.4* 5.5*  ALBUMIN 2.5* 3.1* 2.9* 2.5* 2.6*   Recent Labs  Lab 10/29/20 1511 10/30/20 0939 10/30/20 1754  LIPASE 54* 51 38   Coagulation Profile: Recent Labs  Lab 11/01/20 0620  INR 1.0   Urine analysis:    Component Value Date/Time   COLORURINE YELLOW (A) 10/30/2020 1941   APPEARANCEUR CLEAR (A) 10/30/2020 1941   APPEARANCEUR Clear 01/20/2013 1349   LABSPEC 1.014 10/30/2020 1941   LABSPEC 1.009 01/20/2013 1349   PHURINE 7.0 10/30/2020 1941   GLUCOSEU NEGATIVE 10/30/2020 1941   GLUCOSEU Negative 01/20/2013 1349   HGBUR NEGATIVE 10/30/2020 1941   BILIRUBINUR NEGATIVE 10/30/2020 1941   BILIRUBINUR Negative 01/20/2013 1349   KETONESUR NEGATIVE 10/30/2020 1941   PROTEINUR NEGATIVE 10/30/2020 1941   NITRITE NEGATIVE 10/30/2020 1941   LEUKOCYTESUR NEGATIVE 10/30/2020 1941   LEUKOCYTESUR Negative 01/20/2013 1349   Recent Results (from the past 240 hour(s))  Resp Panel by RT-PCR (Flu A&B, Covid) Nasopharyngeal Swab     Status: None   Collection Time: 10/29/20  7:26 PM   Specimen: Nasopharyngeal Swab; Nasopharyngeal(NP) swabs in vial transport medium  Result Value Ref Range Status   SARS  Coronavirus 2 by RT PCR NEGATIVE NEGATIVE Final    Comment: (NOTE) SARS-CoV-2 target nucleic acids are NOT DETECTED.  The SARS-CoV-2 RNA is generally detectable in upper respiratory specimens during the acute phase of infection. The lowest concentration of SARS-CoV-2 viral copies this assay can detect is 138 copies/mL. A negative result does not preclude SARS-Cov-2 infection and should not be used as the sole basis for treatment or other patient management decisions. A negative result may occur with  improper specimen collection/handling, submission of specimen other than nasopharyngeal swab, presence of viral mutation(s) within the areas targeted by this assay, and inadequate number of viral copies(<138 copies/mL). A negative result must be combined with  clinical observations, patient history, and epidemiological information. The expected result is Negative.  Fact Sheet for Patients:  BloggerCourse.comhttps://www.fda.gov/media/152166/download  Fact Sheet for Healthcare Providers:  SeriousBroker.ithttps://www.fda.gov/media/152162/download  This test is no t yet approved or cleared by the [place] FDA and  has been authorized for detection and/or diagnosis of SARS-CoV-2 by FDA under an Emergency Use Authorization (EUA). This EUA will remain  in effect (meaning this test can be used) for the duration of the COVID-19 declaration under Section 564(b)(1) of the Act, 21 U.S.C.section 360bbb-3(b)(1), unless the authorization is terminated  or revoked sooner.       Influenza A by PCR NEGATIVE NEGATIVE Final   Influenza B by PCR NEGATIVE NEGATIVE Final    Comment: (NOTE) The Xpert Xpress SARS-CoV-2/FLU/RSV plus assay is intended as an aid in the diagnosis of influenza from Nasopharyngeal swab specimens and should not be used as a sole basis for treatment. Nasal washings and aspirates are unacceptable for Xpert Xpress SARS-CoV-2/FLU/RSV testing.  Fact Sheet for  Patients: BloggerCourse.comhttps://www.fda.gov/media/152166/download  Fact Sheet for Healthcare Providers: SeriousBroker.ithttps://www.fda.gov/media/152162/download  This test is not yet approved or cleared by the [place] FDA and has been authorized for detection and/or diagnosis of SARS-CoV-2 by FDA under an Emergency Use Authorization (EUA). This EUA will remain in effect (meaning this test can be used) for the duration of the COVID-19 declaration under Section 564(b)(1) of the Act, 21 U.S.C. section 360bbb-3(b)(1), unless the authorization is terminated or revoked.  Performed at [facility], [address] [place], [place] [address]   Resp Panel by RT-PCR (Flu A&B, Covid) Nasopharyngeal Swab     Status: None   Collection Time: 10/30/20  8:20 PM   Specimen: Nasopharyngeal Swab; Nasopharyngeal(NP) swabs in vial transport medium  Result Value Ref Range Status   SARS Coronavirus 2 by RT PCR NEGATIVE NEGATIVE Final    Comment: (NOTE) SARS-CoV-2 target nucleic acids are NOT DETECTED.  The SARS-CoV-2 RNA is generally detectable in upper respiratory specimens during the acute phase of infection. The lowest concentration of SARS-CoV-2 viral copies this assay can detect is 138 copies/mL. A negative result does not preclude SARS-Cov-2 infection and should not be used as the sole basis for treatment or other patient management decisions. A negative result may occur with  improper specimen collection/handling, submission of specimen other than nasopharyngeal swab, presence of viral mutation(s) within the areas targeted by this assay, and inadequate number of viral copies(<138 copies/mL). A negative result must be combined with clinical observations, patient history, and epidemiological information. The expected result is Negative.  Fact Sheet for Patients:  BloggerCourse.comhttps://www.fda.gov/media/152166/download  Fact Sheet for Healthcare Providers:  SeriousBroker.ithttps://www.fda.gov/media/152162/download  This test is no t  yet approved or cleared by the [place] FDA and  has been authorized for detection and/or diagnosis of SARS-CoV-2 by FDA under an Emergency Use Authorization (EUA). This EUA will remain  in effect (meaning this test can be used) for the duration of the COVID-19 declaration under Section 564(b)(1) of the Act, 21 U.S.C.section 360bbb-3(b)(1), unless the authorization is terminated  or revoked sooner.       Influenza A by PCR NEGATIVE NEGATIVE Final   Influenza B by PCR NEGATIVE NEGATIVE Final    Comment: (NOTE) The Xpert Xpress SARS-CoV-2/FLU/RSV plus assay is intended as an aid in the diagnosis of influenza from Nasopharyngeal swab specimens and should not be used as a sole basis for treatment. Nasal washings and aspirates are unacceptable for Xpert Xpress SARS-CoV-2/FLU/RSV testing.  Fact Sheet for Patients: BloggerCourse.comhttps://www.fda.gov/media/152166/download  Fact Sheet for Healthcare Providers: SeriousBroker.it  This test is not yet approved or cleared by the [place] FDA and has been authorized for detection and/or diagnosis of SARS-CoV-2 by FDA under an Emergency Use Authorization (EUA). This EUA will remain in effect (meaning this test can be used) for the duration of the COVID-19 declaration under Section 564(b)(1) of the Act, 21 U.S.C. section 360bbb-3(b)(1), unless the authorization is terminated or revoked.  Performed at [facility], [address] [place], [place] [address]       Radiology Studies: CT ABDOMEN PELVIS W CONTRAST  Result Date: 10/30/2020 CLINICAL DATA:  Nausea, vomiting. EXAM: CT ABDOMEN AND PELVIS WITH CONTRAST TECHNIQUE: Multidetector CT imaging of the abdomen and pelvis was performed using the standard protocol following bolus administration of intravenous contrast. CONTRAST:  OMNIPAQUE IOHEXOL 300 MG/ML  SOLN COMPARISON:  May 01, 2019. FINDINGS: Lower chest: No acute abnormality. Hepatobiliary: No  gallstones or biliary dilatation is noted. Hepatic steatosis is noted. Pancreas: Unremarkable. No pancreatic ductal dilatation or surrounding inflammatory changes. Spleen: Normal in size without focal abnormality. Adrenals/Urinary Tract: Adrenal glands are unremarkable. Kidneys are normal, without renal calculi, focal lesion, or hydronephrosis. Bladder is unremarkable. Stomach/Bowel: Stomach is within normal limits. Appendix appears normal. No evidence of bowel wall thickening, distention, or inflammatory changes. Vascular/Lymphatic: Aortic atherosclerosis. No enlarged abdominal or pelvic lymph nodes. Reproductive: Prostate is unremarkable. Other: Small fat containing bilateral inguinal hernias are noted. No ascites is noted. Musculoskeletal: No acute or significant osseous findings. IMPRESSION: Hepatic steatosis. No acute abnormality seen in the abdomen or pelvis. Aortic Atherosclerosis (ICD10-I70.0). Electronically Signed   By: Lupita Raider M.D.   On: 10/30/2020 15:51    Scheduled Meds: . folic acid  1 mg Oral Daily  . LORazepam  0.5 mg Oral Q6H  . multivitamin with minerals  1 tablet Oral Daily  . thiamine  100 mg Oral Daily   Or  . thiamine  100 mg Intravenous Daily   Continuous Infusions:   LOS: 2 days   Time spent: 25 minutes.  Tyrone Nine, MD [facility] [email or web address] 11/01/2020, 11:13 AM

## 2020-11-01 NOTE — Evaluation (Signed)
Physical Therapy Evaluation Patient Details Name: Eric Booker MRN: [id number] DOB: 1960/08/21 Today's Date: 11/01/2020   History of Present Illness  Pt admitted for hyponatremia with complaints of N/V. Of note, pt just recently here for similar symptoms and left AMA yesterday. History of alcohol abuse, drinks daily. Pt with mild tremors at time of evaluation.  Clinical Impression  Pt is a pleasant 60 year old male who was admitted for hyponatremia. Pt performs bed mobility with independence, transfers with mod I, and ambulation with cga and RW. Pt demonstrates deficits with balance. Recommend to use RW at all times. Pt didn't feel comfortable ambulating without AD despite cues. Would benefit from skilled PT to address above deficits and promote optimal return to PLOF. No further PT recommended post hospital dc. Will maintain on current case load to prevent deconditioning.    Follow Up Recommendations No PT follow up (will keep on caseload to improve balance and maintain mobility)    Equipment Recommendations  Rolling walker with 5" wheels    Recommendations for Other Services       Precautions / Restrictions Precautions Precautions: Fall Restrictions Weight Bearing Restrictions: No      Mobility  Bed Mobility Overal bed mobility: Independent             General bed mobility comments: easily adjusts to EOB. UPright posture noted    Transfers Overall transfer level: Modified independent Equipment used: Rolling walker (2 wheeled)             General transfer comment: initial unsteadiness. RW given with improved balance.  Ambulation/Gait Ambulation/Gait assistance: Min guard Gait Distance (Feet): 500 Feet Assistive device: Rolling walker (2 wheeled) Gait Pattern/deviations: Step-through pattern     General Gait Details: ambulated in hallway using RW. Cues given for sequencing including keeping close to RW and upright posture. B UE tremors noted as well as  unsteadiness. Improved with increased distance  Stairs            Wheelchair Mobility    Modified Rankin (Stroke Patients Only)       Balance Overall balance assessment: Mild deficits observed, not formally tested                                           Pertinent Vitals/Pain Pain Assessment: No/denies pain    Home Living Family/patient expects to be discharged to:: Private residence Living Arrangements: Alone   Type of Home: House Home Access: Stairs to enter Entrance Stairs-Rails: Can reach both Entrance Stairs-Number of Steps: 5 Home Layout: One level Home Equipment: None      Prior Function Level of Independence: Independent         Comments: 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     Hand Dominance        Extremity/Trunk Assessment   Upper Extremity Assessment Upper Extremity Assessment: Overall WFL for tasks assessed (mild tremors noted in shoulders)    Lower Extremity Assessment Lower Extremity Assessment: Overall WFL for tasks assessed       Communication   Communication: No difficulties  Cognition Arousal/Alertness: Awake/alert Behavior During Therapy: WFL for tasks assessed/performed Overall Cognitive Status: Within Functional Limits for tasks assessed  General Comments      Exercises Other Exercises Other Exercises: cues given for gait seqeuncing and safety   Assessment/Plan    PT Assessment Patient needs continued PT services  PT Problem List Decreased balance       PT Treatment Interventions DME instruction;Gait training;Balance training    PT Goals (Current goals can be found in the Care Plan section)  Acute Rehab PT Goals Patient Stated Goal: to improve balance PT Goal Formulation: With patient Time For Goal Achievement: 11/15/20 Potential to Achieve Goals: Good    Frequency Min 2X/week   Barriers to discharge         Co-evaluation               AM-PAC PT "6 Clicks" Mobility  Outcome Measure Help needed turning from your back to your side while in a flat bed without using bedrails?: None Help needed moving from lying on your back to sitting on the side of a flat bed without using bedrails?: None Help needed moving to and from a bed to a chair (including a wheelchair)?: None Help needed standing up from a chair using your arms (e.g., wheelchair or bedside chair)?: A Little Help needed to walk in hospital room?: A Little Help needed climbing 3-5 steps with a railing? : A Little 6 Click Score: 21    End of Session Equipment Utilized During Treatment: Gait belt Activity Tolerance: Patient tolerated treatment well Patient left: in chair;with chair alarm set Nurse Communication: Mobility status PT Visit Diagnosis: Unsteadiness on feet (R26.81);Difficulty in walking, not elsewhere classified (R26.2)    Time: 5170-0174 PT Time Calculation (min) (ACUTE ONLY): 18 min   Charges:   PT Evaluation $PT Eval Low Complexity: 1 Low PT Treatments $Gait Training: 8-22 mins        Eric Booker, PT, DPT [phone]   Eric Booker 11/01/2020, 3:59 PM

## 2020-11-02 DIAGNOSIS — G9341 Metabolic encephalopathy: Secondary | ICD-10-CM | POA: Diagnosis not present

## 2020-11-02 DIAGNOSIS — K7010 Alcoholic hepatitis without ascites: Secondary | ICD-10-CM

## 2020-11-02 DIAGNOSIS — F10230 Alcohol dependence with withdrawal, uncomplicated: Secondary | ICD-10-CM

## 2020-11-02 DIAGNOSIS — E871 Hypo-osmolality and hyponatremia: Secondary | ICD-10-CM | POA: Diagnosis not present

## 2020-11-02 LAB — COMPREHENSIVE METABOLIC PANEL
ALT: 115 U/L — ABNORMAL HIGH (ref 0–44)
AST: 174 U/L — ABNORMAL HIGH (ref 15–41)
Albumin: 2.7 g/dL — ABNORMAL LOW (ref 3.5–5.0)
Alkaline Phosphatase: 80 U/L (ref 38–126)
Anion gap: 6 (ref 5–15)
BUN: <5 mg/dL — ABNORMAL LOW (ref 6–20)
CO2: 25 mmol/L (ref 22–32)
Calcium: 8.2 mg/dL — ABNORMAL LOW (ref 8.9–10.3)
Chloride: 102 mmol/L (ref 98–111)
Creatinine, Ser: 0.63 mg/dL (ref 0.61–1.24)
GFR, Estimated: >60 mL/min (ref >60)
Glucose, Bld: 106 mg/dL — ABNORMAL HIGH (ref 70–99)
Potassium: 4.2 mmol/L (ref 3.5–5.1)
Sodium: 133 mmol/L — ABNORMAL LOW (ref 135–145)
Total Bilirubin: 1.3 mg/dL — ABNORMAL HIGH (ref 0.3–1.2)
Total Protein: 5.9 g/dL — ABNORMAL LOW (ref 6.5–8.1)

## 2020-11-02 MED ORDER — THIAMINE HCL 100 MG PO TABS: 100.0000 mg | ORAL_TABLET | Freq: Every day | ORAL | 1 refills | Status: AC

## 2020-11-02 NOTE — Discharge Summary (Signed)
Physician Discharge Summary  Eric Booker JAS:[id number] DOB: 29-Jan-1961 DOA: 10/30/2020  PCP: Pcp, No  Admit date: 10/30/2020 Discharge date: 11/02/2020  Admitted From: Home Disposition: Home   Recommendations for Outpatient Follow-up:  1. Follow up with PCP in 1-2 weeks 2. Monitor CMP, CBC at follow up with attention to sodium, LFTs, platelets.  Home Health: None recommended  Equipment/Devices: Rolling walker Discharge Condition: Stable CODE STATUS: Full Diet recommendation: Regular  Brief/Interim Summary: Eric Booker is a 60 y.o. male with a history of beer potomania and alcohol abuse with withdrawal who initially presented to the ED 4/28 with nausea and vomiting found to have hyponatremia and was admitted with CIWA protocol in place with IV fluids. He left AMA the next day only to be brought back by police later that evening for appearing unsteady and confused. He was diaphoretic, tachycardic and having hallucinations in the ED, readmitted 4/29, started on IVF with improvement in hyponatremia and stable alcohol withdrawal requiring scheduled and prn ativan. With supportive care the patient has improved with no evidence of acute withdrawal on discharge. He is steady with PT recommending no further PT at follow up.   Discharge Diagnoses:  Active Problems:   Hyponatremia   Alcoholic hepatitis without ascites   Alcohol dependence with uncomplicated withdrawal (HCC)   Acute metabolic encephalopathy  Alcohol withdrawal: EtOH still negative on representation. - Completed ativan taper, withdrawal symptoms have subsided.  - Alcohol cessation reviewed.  Acute metabolic encephalopathy:  - Resolved. Continue thiamine.  Hyponatremia: Likely beer potomania, inadequate solute intake, and dehydration due to recent vomiting. TSH wnl. - Significantly improved. Euvolemic at DC.  Alcoholic hepatitis, LFT elevation: Hepatic steatosis seen on CT A/P and U/S on 4/29. Improving LFTs. INR 1.0.  TBili settling at 2.0.  - Abstain from alcohol, no indication for prednisolone. LFTs improved, still AST > ALT.  Thrombocytopenia: Likely related to alcohol abuse and hepatic steatosis. - Has remained stable without evidence of bleeding.  Hypokalemia:  - Supplemented    Discharge Instructions Discharge Instructions    Discharge instructions   Complete by: As directed    Abstain from alcohol Continue taking thiamine daily Follow up with your PCP in the next 1-2 weeks with plans to recheck labs. Your liver function tests show damage to your liver that is improving at discharge.     Allergies as of 11/02/2020      Reactions   Bee Venom Swelling      Medication List    TAKE these medications   thiamine 100 MG tablet Take 1 tablet (100 mg total) by mouth daily. Start taking on: Nov 03, 2020            Durable Medical Equipment  (From admission, onward)         Start     Ordered   Unscheduled  DME Dan Humphreys  Once       Question Answer Comment  Walker: With 5 Inch Wheels   Patient needs a walker to treat with the following condition Gait instability      11/02/20 0957          Follow-up Information    [facility] Follow up.   Contact information: [address] [place] [address] [phone]              Allergies  Allergen Reactions  . Bee Venom Swelling    Consultations:  None  Procedures/Studies: CT ABDOMEN PELVIS W CONTRAST  Result Date: 10/30/2020 CLINICAL DATA:  Nausea,  vomiting. EXAM: CT ABDOMEN AND PELVIS WITH CONTRAST TECHNIQUE: Multidetector CT imaging of the abdomen and pelvis was performed using the standard protocol following bolus administration of intravenous contrast. CONTRAST:  OMNIPAQUE IOHEXOL 300 MG/ML  SOLN COMPARISON:  May 01, 2019. FINDINGS: Lower chest: No acute abnormality. Hepatobiliary: No gallstones or biliary dilatation is noted. Hepatic steatosis is noted. Pancreas: Unremarkable. No pancreatic  ductal dilatation or surrounding inflammatory changes. Spleen: Normal in size without focal abnormality. Adrenals/Urinary Tract: Adrenal glands are unremarkable. Kidneys are normal, without renal calculi, focal lesion, or hydronephrosis. Bladder is unremarkable. Stomach/Bowel: Stomach is within normal limits. Appendix appears normal. No evidence of bowel wall thickening, distention, or inflammatory changes. Vascular/Lymphatic: Aortic atherosclerosis. No enlarged abdominal or pelvic lymph nodes. Reproductive: Prostate is unremarkable. Other: Small fat containing bilateral inguinal hernias are noted. No ascites is noted. Musculoskeletal: No acute or significant osseous findings. IMPRESSION: Hepatic steatosis. No acute abnormality seen in the abdomen or pelvis. Aortic Atherosclerosis (ICD10-I70.0). Electronically Signed   By: Lupita Raider M.D.   On: 10/30/2020 15:51   US Abdomen Limited RUQ (LIVER/GB)  Result Date: 10/30/2020 CLINICAL DATA:  Acute generalized abdominal pain. EXAM: ULTRASOUND ABDOMEN LIMITED RIGHT UPPER QUADRANT COMPARISON:  None. FINDINGS: Gallbladder: No gallstones or wall thickening visualized. No sonographic Murphy sign noted by sonographer. Common bile duct: Diameter: 4 mm which is within normal limits. Liver: No focal lesion identified. Increased echogenicity of hepatic parenchyma is noted suggesting hepatic steatosis. Portal vein is patent on color Doppler imaging with normal direction of blood flow towards the liver. Other: None. IMPRESSION: Hepatic steatosis. No other abnormality seen in the right upper quadrant of the abdomen. Electronically Signed   By: Lupita Raider M.D.   On: 10/30/2020 08:43    Subjective: Feels well, thinking clearly, no tremor agitation or anxiety.  Discharge Exam: Vitals:   11/01/20 2339 11/02/20 0400  BP: 122/81 121/86  Pulse: 82 76  Resp:    Temp:    SpO2:     General: Pt is alert, awake, not in acute distress Cardiovascular: RRR, S1/S2 +, no  rubs, no gallops Respiratory: CTA bilaterally, no wheezing, no rhonchi Abdominal: Soft, NT, ND, bowel sounds + Extremities: No edema, no cyanosis  Labs: BNP (last 3 results) No results for input(s): BNP in the last 8760 hours. Basic Metabolic Panel: Recent Labs  Lab 10/29/20 2144 10/30/20 0500 10/30/20 0939 10/30/20 1754 10/30/20 2247 10/31/20 0446 11/01/20 0620 11/02/20 0350  NA 127*   < >  --  126* 132* 133* 134* 133*  K 3.5   < >  --  4.1 3.9 3.4* 4.2 4.2  CL 97*   < >  --  98 101 104 102 102  CO2 21*   < >  --  19* 27 24 24 25   GLUCOSE 136*   < >  --  124* 105* 97 89 106*  BUN 8   < >  --  <5* <5* <5* <5* <5*  CREATININE 0.87   < >  --  0.70 0.75 0.76 0.66 0.63  CALCIUM 7.4*   < >  --  8.3* 8.1* 7.9* 8.1* 8.2*  MG 1.4*  --  2.3  --  1.6*  --   --   --   PHOS 2.7  --   --   --  1.7*  --   --   --    < > = values in this interval not displayed.   Liver Function Tests: Recent Labs  Lab 10/30/20 1754 10/30/20 2247 10/31/20 0446 11/01/20 0620 11/02/20 0350  AST 408* 328* 275* 229* 174*  ALT 145* 126* 111* 115* 115*  ALKPHOS 79 70 64 68 80  BILITOT 2.6* 1.9* 2.0* 2.0* 1.3*  PROT 6.5 5.8* 5.4* 5.5* 5.9*  ALBUMIN 3.1* 2.9* 2.5* 2.6* 2.7*   Recent Labs  Lab 10/29/20 1511 10/30/20 0939 10/30/20 1754  LIPASE 54* 51 38   No results for input(s): AMMONIA in the last 168 hours. CBC: Recent Labs  Lab 10/30/20 0500 10/30/20 1754 10/30/20 2247 10/31/20 0446 11/01/20 0620  WBC 5.0 5.8 6.0 5.6 5.9  HGB 12.5* 13.9 12.8* 12.1* 13.2  HCT 34.5* 38.5* 36.6* 34.0* 38.3*  MCV 89.6 89.5 91.7 91.9 92.5  PLT 77* 73* 67* 61* 71*   Cardiac Enzymes: No results for input(s): CKTOTAL, CKMB, CKMBINDEX, TROPONINI in the last 168 hours. BNP: Invalid input(s): POCBNP CBG: No results for input(s): GLUCAP in the last 168 hours. D-Dimer No results for input(s): DDIMER in the last 72 hours. Hgb A1c No results for input(s): HGBA1C in the last 72 hours. Lipid Profile No results  for input(s): CHOL, HDL, LDLCALC, TRIG, CHOLHDL, LDLDIRECT in the last 72 hours. Thyroid function studies No results for input(s): TSH, T4TOTAL, T3FREE, THYROIDAB in the last 72 hours.  Invalid input(s): FREET3 Anemia work up No results for input(s): VITAMINB12, FOLATE, FERRITIN, TIBC, IRON, RETICCTPCT in the last 72 hours. Urinalysis    Component Value Date/Time   COLORURINE YELLOW (A) 10/30/2020 1941   APPEARANCEUR CLEAR (A) 10/30/2020 1941   APPEARANCEUR Clear 01/20/2013 1349   LABSPEC 1.014 10/30/2020 1941   LABSPEC 1.009 01/20/2013 1349   PHURINE 7.0 10/30/2020 1941   GLUCOSEU NEGATIVE 10/30/2020 1941   GLUCOSEU Negative 01/20/2013 1349   HGBUR NEGATIVE 10/30/2020 1941   BILIRUBINUR NEGATIVE 10/30/2020 1941   BILIRUBINUR Negative 01/20/2013 1349   KETONESUR NEGATIVE 10/30/2020 1941   PROTEINUR NEGATIVE 10/30/2020 1941   NITRITE NEGATIVE 10/30/2020 1941   LEUKOCYTESUR NEGATIVE 10/30/2020 1941   LEUKOCYTESUR Negative 01/20/2013 1349    Microbiology Recent Results (from the past 240 hour(s))  Resp Panel by RT-PCR (Flu A&B, Covid) Nasopharyngeal Swab     Status: None   Collection Time: 10/29/20  7:26 PM   Specimen: Nasopharyngeal Swab; Nasopharyngeal(NP) swabs in vial transport medium  Result Value Ref Range Status   SARS Coronavirus 2 by RT PCR NEGATIVE NEGATIVE Final    Comment: (NOTE) SARS-CoV-2 target nucleic acids are NOT DETECTED.  The SARS-CoV-2 RNA is generally detectable in upper respiratory specimens during the acute phase of infection. The lowest concentration of SARS-CoV-2 viral copies this assay can detect is 138 copies/mL. A negative result does not preclude SARS-Cov-2 infection and should not be used as the sole basis for treatment or other patient management decisions. A negative result may occur with  improper specimen collection/handling, submission of specimen other than nasopharyngeal swab, presence of viral mutation(s) within the areas targeted by  this assay, and inadequate number of viral copies(<138 copies/mL). A negative result must be combined with clinical observations, patient history, and epidemiological information. The expected result is Negative.  Fact Sheet for Patients:  BloggerCourse.comhttps://www.fda.gov/media/152166/download  Fact Sheet for Healthcare Providers:  SeriousBroker.ithttps://www.fda.gov/media/152162/download  This test is no t yet approved or cleared by the [place] FDA and  has been authorized for detection and/or diagnosis of SARS-CoV-2 by FDA under an Emergency Use Authorization (EUA). This EUA will remain  in effect (meaning this test can be used) for the duration of the  COVID-19 declaration under Section 564(b)(1) of the Act, 21 U.S.C.section 360bbb-3(b)(1), unless the authorization is terminated  or revoked sooner.       Influenza A by PCR NEGATIVE NEGATIVE Final   Influenza B by PCR NEGATIVE NEGATIVE Final    Comment: (NOTE) The Xpert Xpress SARS-CoV-2/FLU/RSV plus assay is intended as an aid in the diagnosis of influenza from Nasopharyngeal swab specimens and should not be used as a sole basis for treatment. Nasal washings and aspirates are unacceptable for Xpert Xpress SARS-CoV-2/FLU/RSV testing.  Fact Sheet for Patients: BloggerCourse.com  Fact Sheet for Healthcare Providers: SeriousBroker.it  This test is not yet approved or cleared by the [place] FDA and has been authorized for detection and/or diagnosis of SARS-CoV-2 by FDA under an Emergency Use Authorization (EUA). This EUA will remain in effect (meaning this test can be used) for the duration of the COVID-19 declaration under Section 564(b)(1) of the Act, 21 U.S.C. section 360bbb-3(b)(1), unless the authorization is terminated or revoked.  Performed at [facility], [address] [place], [place] [address]   Resp Panel by RT-PCR (Flu A&B, Covid) Nasopharyngeal Swab     Status: None    Collection Time: 10/30/20  8:20 PM   Specimen: Nasopharyngeal Swab; Nasopharyngeal(NP) swabs in vial transport medium  Result Value Ref Range Status   SARS Coronavirus 2 by RT PCR NEGATIVE NEGATIVE Final    Comment: (NOTE) SARS-CoV-2 target nucleic acids are NOT DETECTED.  The SARS-CoV-2 RNA is generally detectable in upper respiratory specimens during the acute phase of infection. The lowest concentration of SARS-CoV-2 viral copies this assay can detect is 138 copies/mL. A negative result does not preclude SARS-Cov-2 infection and should not be used as the sole basis for treatment or other patient management decisions. A negative result may occur with  improper specimen collection/handling, submission of specimen other than nasopharyngeal swab, presence of viral mutation(s) within the areas targeted by this assay, and inadequate number of viral copies(<138 copies/mL). A negative result must be combined with clinical observations, patient history, and epidemiological information. The expected result is Negative.  Fact Sheet for Patients:  BloggerCourse.com  Fact Sheet for Healthcare Providers:  SeriousBroker.it  This test is no t yet approved or cleared by the [place] FDA and  has been authorized for detection and/or diagnosis of SARS-CoV-2 by FDA under an Emergency Use Authorization (EUA). This EUA will remain  in effect (meaning this test can be used) for the duration of the COVID-19 declaration under Section 564(b)(1) of the Act, 21 U.S.C.section 360bbb-3(b)(1), unless the authorization is terminated  or revoked sooner.       Influenza A by PCR NEGATIVE NEGATIVE Final   Influenza B by PCR NEGATIVE NEGATIVE Final    Comment: (NOTE) The Xpert Xpress SARS-CoV-2/FLU/RSV plus assay is intended as an aid in the diagnosis of influenza from Nasopharyngeal swab specimens and should not be used as a sole basis for treatment.  Nasal washings and aspirates are unacceptable for Xpert Xpress SARS-CoV-2/FLU/RSV testing.  Fact Sheet for Patients: BloggerCourse.com  Fact Sheet for Healthcare Providers: SeriousBroker.it  This test is not yet approved or cleared by the [place] FDA and has been authorized for detection and/or diagnosis of SARS-CoV-2 by FDA under an Emergency Use Authorization (EUA). This EUA will remain in effect (meaning this test can be used) for the duration of the COVID-19 declaration under Section 564(b)(1) of the Act, 21 U.S.C. section 360bbb-3(b)(1), unless the authorization is terminated or revoked.  Performed at [facility], [address] [place], [place] [address]     Time coordinating discharge: Approximately 40 minutes  Tyrone Nine, MD  [facility] 11/02/2020, 9:57 AM

## 2020-11-02 NOTE — TOC Initial Note (Signed)
Transition of Care [facility]) - Initial/Assessment Note    Patient Details  Name: Eric Booker MRN: [id number] Date of Birth: 1960-09-29  [facility] CM/SW Contact:    Shelbie Hutching, RN Phone Number: 11/02/2020, 2:04 PM  Clinical Narrative:                 Patient admitted for alcohol withdrawal.  TOC received consult for substance abuse resources.  RNCM met with patient at the bedside, he will discharge home today.  Substance abuse resources provided, inpatient and outpatient resources included.  Patient reports that he is thinking about quitting especially since he was admitted to the hospital.  Patient is from home, he reports he needs a ride.  [facility] transport arranged to pick patient up.  Patient reports that his scooter is at his PCP office in [place], patient request drop off there.    Ride has been called and expected to arrive at 1412.    Expected Discharge Plan: Home/Self Care Barriers to Discharge: No Barriers Identified   Patient Goals and CMS Choice Patient states their goals for this hospitalization and ongoing recovery are:: Ready to go home      Expected Discharge Plan and Services Expected Discharge Plan: Home/Self Care   Discharge Planning Services: CM Consult   Living arrangements for the past 2 months: Mobile Home Expected Discharge Date: 11/02/20               DME Arranged: N/A DME Agency: NA       HH Arranged: NA HH Agency: NA        Prior Living Arrangements/Services Living arrangements for the past 2 months: Mobile Home Lives with:: Self Patient language and need for interpreter reviewed:: Yes Do you feel safe going back to the place where you live?: Yes      Need for Family Participation in Patient Care: No (Comment) Care giver support system in place?: No (comment)   Criminal Activity/Legal Involvement Pertinent to Current Situation/Hospitalization: No - Comment as needed  Activities of Daily Living Home Assistive  Devices/Equipment: None ADL Screening (condition at time of admission) Patient's cognitive ability adequate to safely complete daily activities?: No Is the patient deaf or have difficulty hearing?: No Does the patient have difficulty seeing, even when wearing glasses/contacts?: No Does the patient have difficulty concentrating, remembering, or making decisions?: No Patient able to express need for assistance with ADLs?: Yes Does the patient have difficulty dressing or bathing?: Yes Independently performs ADLs?: No Communication: Independent Dressing (OT): Needs assistance Is this a change from baseline?: Change from baseline, expected to last <3days Grooming: Needs assistance Is this a change from baseline?: Change from baseline, expected to last <3 days Feeding: Independent Bathing: Needs assistance Is this a change from baseline?: Change from baseline, expected to last <3 days Toileting: Needs assistance Is this a change from baseline?: Change from baseline, expected to last <3 days In/Out Bed: Needs assistance Is this a change from baseline?: Change from baseline, expected to last <3 days Walks in Home: Needs assistance Is this a change from baseline?: Change from baseline, expected to last <3 days Does the patient have difficulty walking or climbing stairs?: Yes Weakness of Legs: Both Weakness of Arms/Hands: None  Permission Sought/Granted   Permission granted to share information with : No              Emotional Assessment Appearance:: Appears stated age Attitude/Demeanor/Rapport: Engaged Affect (typically observed): Accepting Orientation: : Oriented to Self,Oriented to Place,Oriented  to  Time,Oriented to Situation Alcohol / Substance Use: Alcohol Use Psych Involvement: No (comment)  Admission diagnosis:  Hyponatremia [E87.1] Alcohol withdrawal syndrome, with delirium ([place]) [F10.231] Patient Active Problem List   Diagnosis Date Noted  . Alcoholic hepatitis without  ascites 11/01/2020  . Alcohol dependence with uncomplicated withdrawal ([place]) 11/01/2020  . Acute metabolic encephalopathy 14/43/1540  . Hyponatremia 10/29/2020   PCP:  Remi Haggard, FNP Pharmacy:   [id number] [place], [place] [id number]: [phone]: [phone]    Social Determinants of Health (SDOH) Interventions    Readmission Risk Interventions No flowsheet data found.

## 2020-11-02 NOTE — Progress Notes (Signed)
Removed IV before discharge. Went over discharge instructions with patient. Patient stated he understood and all questions were answered. Patient going home with [facility] car transport, he is being taken back to his PCP's office to get his moped.

## 2021-09-22 IMAGING — US US ABDOMEN LIMITED RUQ/ASCITES
1 series · 14 of 25 positions shown · non-contrast
Comparison: None.

CLINICAL DATA: Acute generalized abdominal pain.

EXAM:
ULTRASOUND ABDOMEN LIMITED RIGHT UPPER QUADRANT

[Series 1: us abdomen limited ruq (liver/gb) · 14 of 59 slices shown]
[im 1/59]
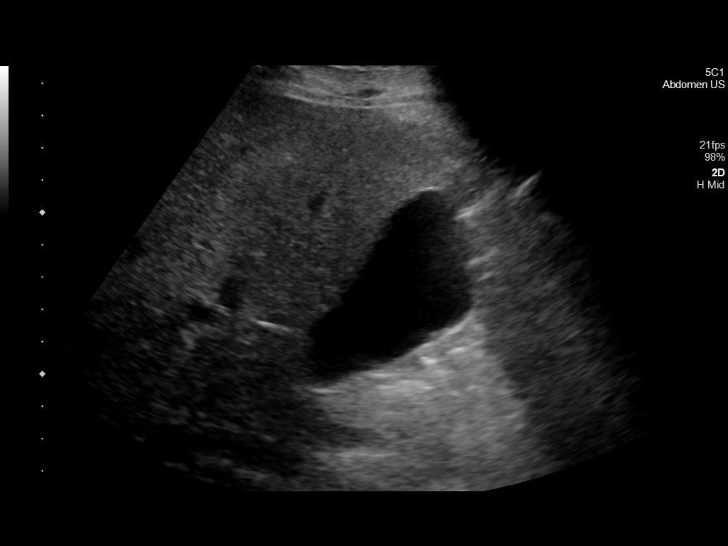
[im 5/59]
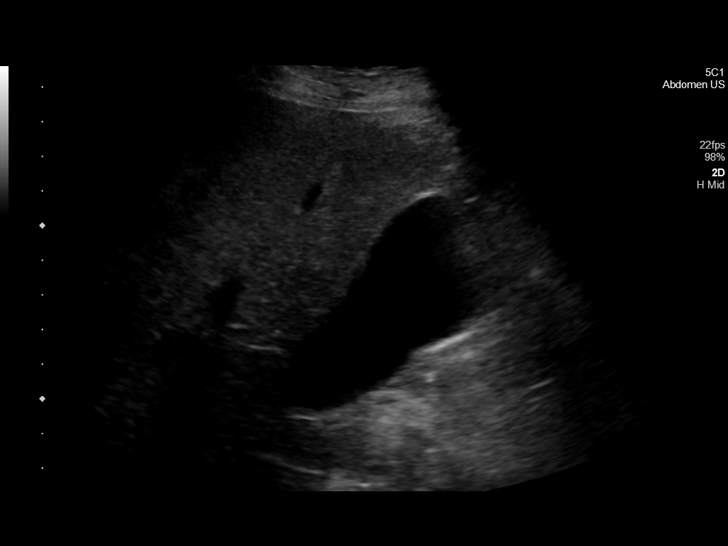
[im 10/59]
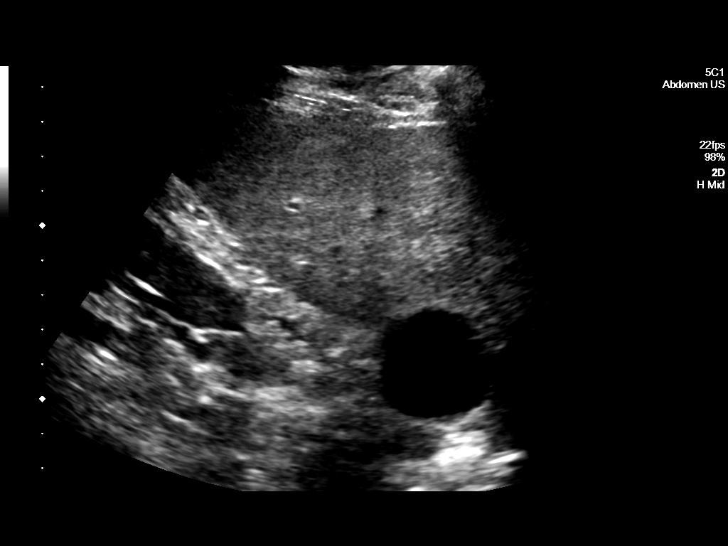
[im 15/59]
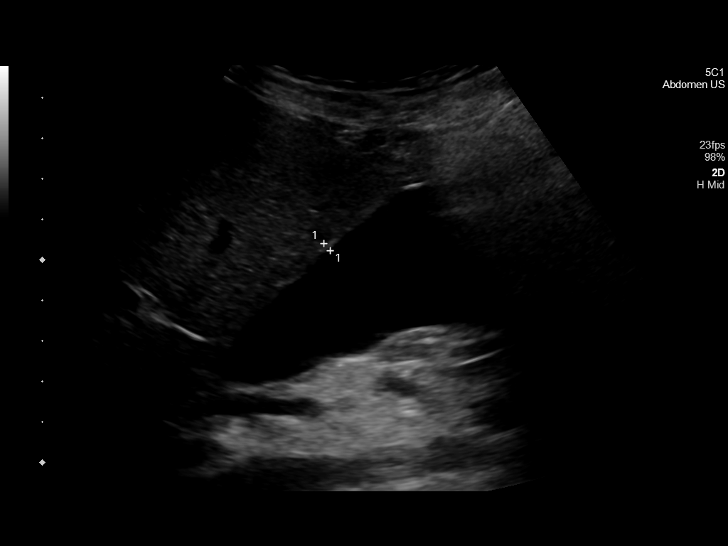
[im 20/59]
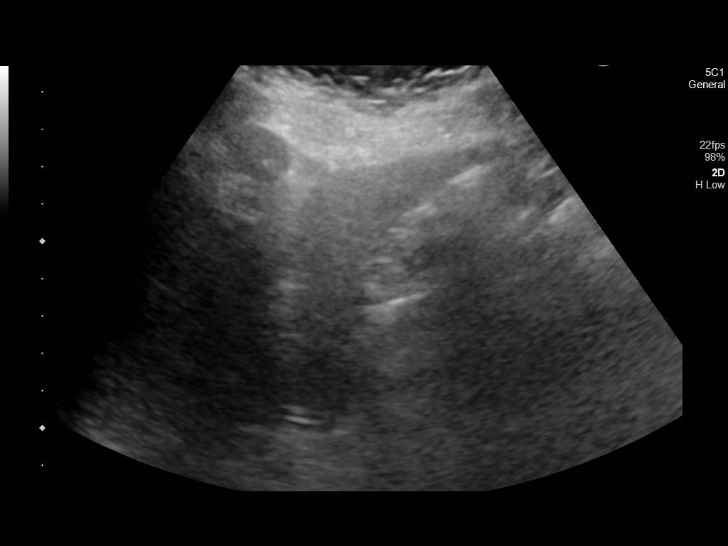
[im 22/59]
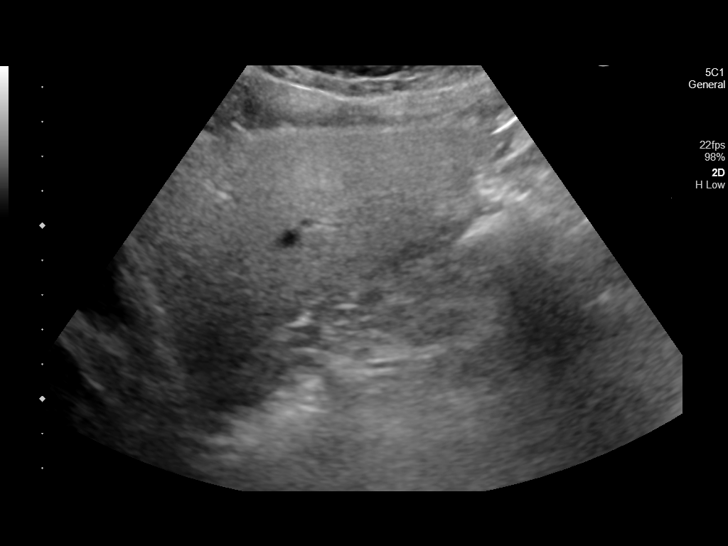
[im 27/59]
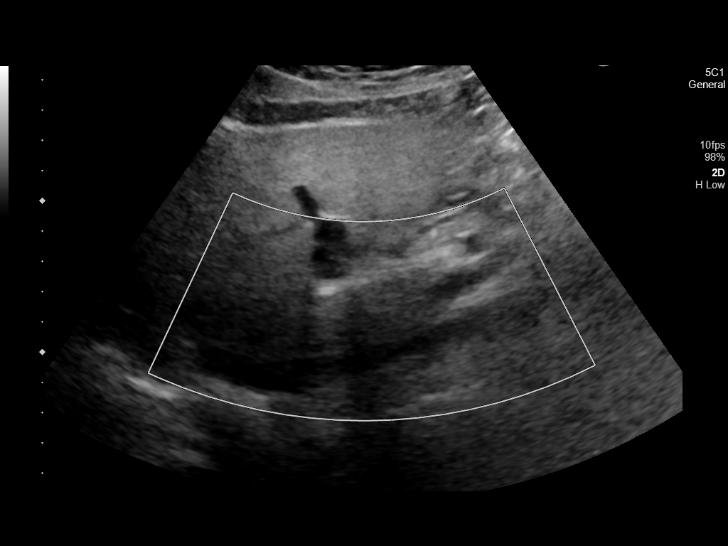
[im 32/59]
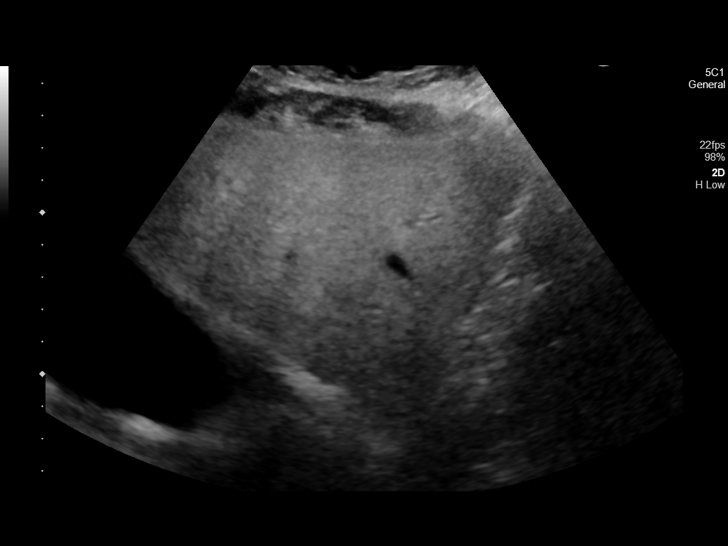
[im 37/59]
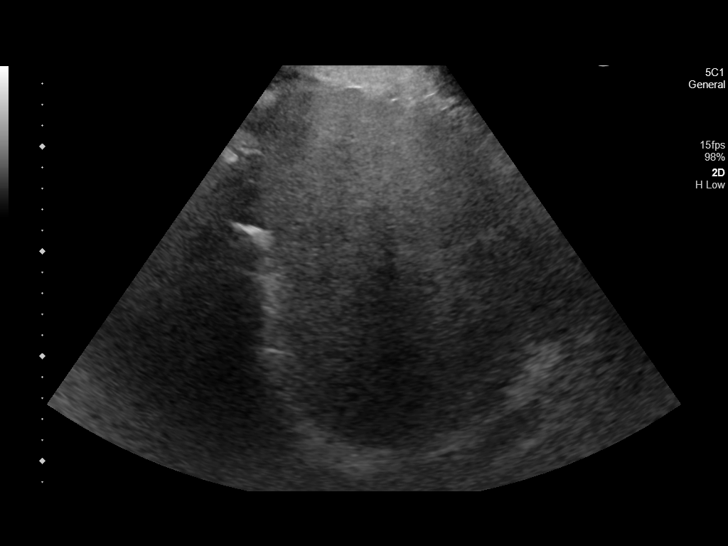
[im 39/59]
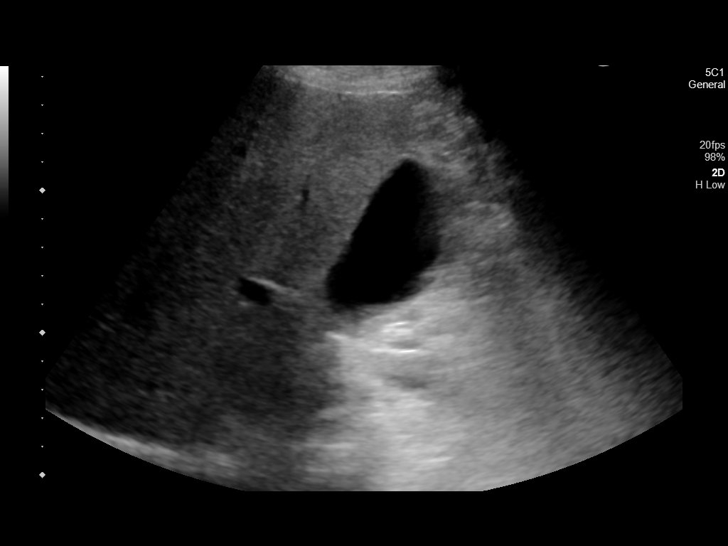
[im 44/59]
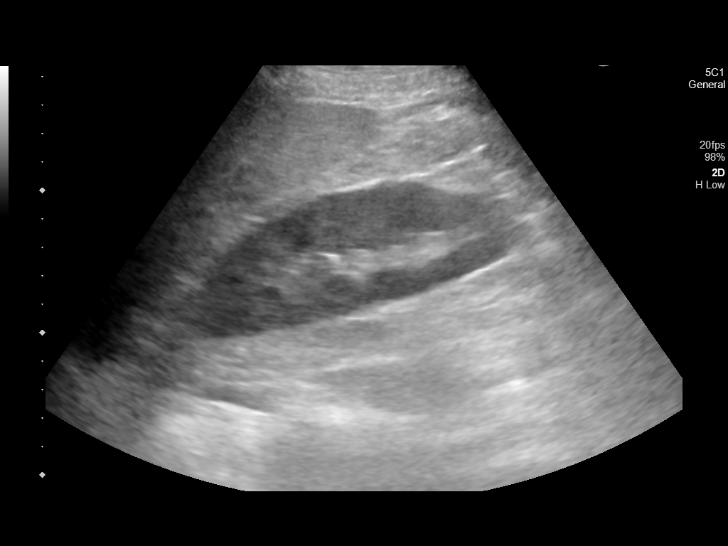
[im 49/59]
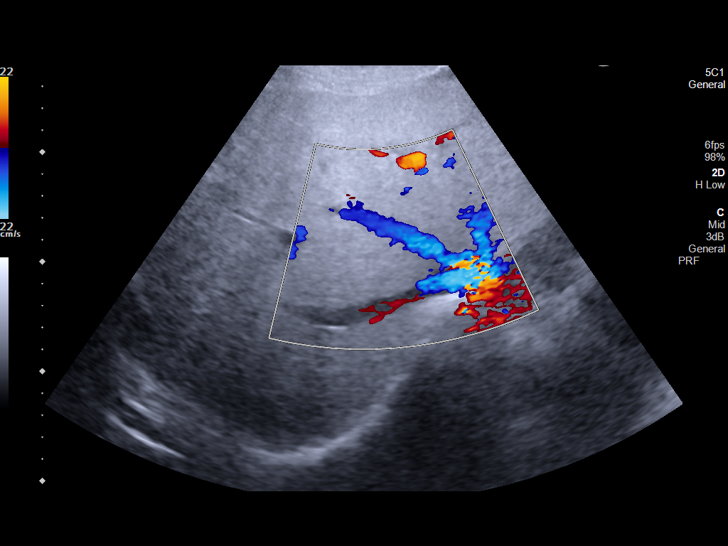
[im 54/59]
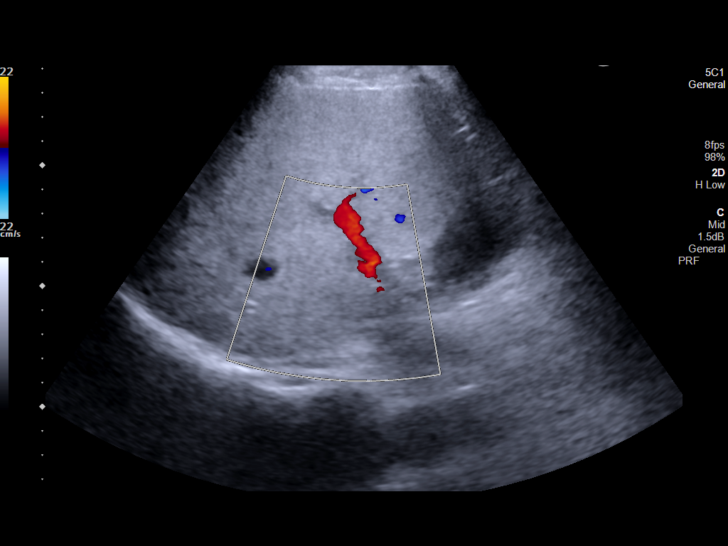
[im 59/59]
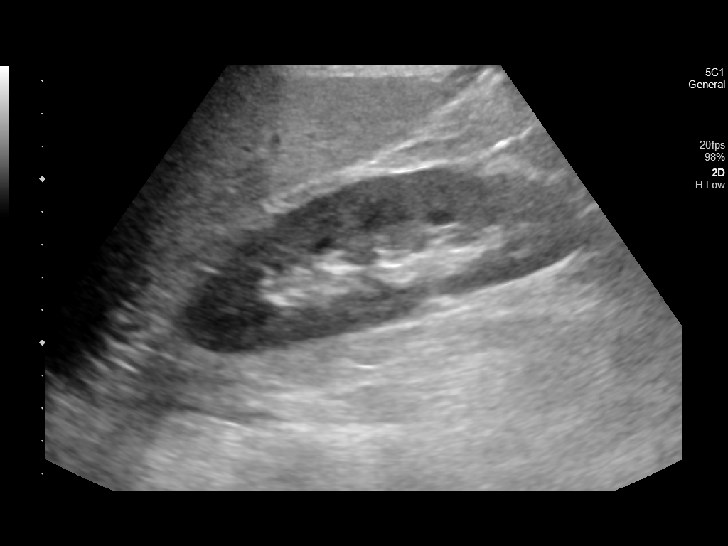

[14 of 25 positions shown; findings below may reference images not displayed]

FINDINGS: Gallbladder:

No gallstones or wall thickening visualized. No sonographic Murphy
sign noted by sonographer.

Common bile duct:

Diameter: 4 mm which is within normal limits.

Liver:

No focal lesion identified. Increased echogenicity of hepatic
parenchyma is noted suggesting hepatic steatosis. Portal vein is
patent on color Doppler imaging with normal direction of blood flow
towards the liver.

Other: None.
IMPRESSION: Hepatic steatosis. No other abnormality seen in the right upper
quadrant of the abdomen.
